# Patient Record
Sex: Female | Born: 1970 | Race: White | Marital: Married | Smoking: Former smoker
Health system: Southern US, Community
[De-identification: ages and names within clinical notes are randomized; demographics above are authoritative.]

## PROBLEM LIST (undated history)

## (undated) DIAGNOSIS — R5383 Other fatigue: Secondary | ICD-10-CM

## (undated) DIAGNOSIS — E559 Vitamin D deficiency, unspecified: Secondary | ICD-10-CM

## (undated) DIAGNOSIS — R002 Palpitations: Secondary | ICD-10-CM

## (undated) DIAGNOSIS — E538 Deficiency of other specified B group vitamins: Secondary | ICD-10-CM

## (undated) DIAGNOSIS — N939 Abnormal uterine and vaginal bleeding, unspecified: Secondary | ICD-10-CM

## (undated) DIAGNOSIS — M549 Dorsalgia, unspecified: Secondary | ICD-10-CM

## (undated) DIAGNOSIS — I10 Essential (primary) hypertension: Secondary | ICD-10-CM

## (undated) DIAGNOSIS — R7303 Prediabetes: Secondary | ICD-10-CM

## (undated) DIAGNOSIS — Z789 Other specified health status: Secondary | ICD-10-CM

## (undated) HISTORY — PX: WISDOM TOOTH EXTRACTION: SHX21

## (undated) HISTORY — DX: Palpitations: R00.2

## (undated) HISTORY — DX: Abnormal uterine and vaginal bleeding, unspecified: N93.9

## (undated) HISTORY — PX: OTHER SURGICAL HISTORY: SHX169

## (undated) HISTORY — DX: Other specified health status: Z78.9

## (undated) HISTORY — DX: Essential (primary) hypertension: I10

## (undated) HISTORY — DX: Prediabetes: R73.03

## (undated) HISTORY — DX: Deficiency of other specified B group vitamins: E53.8

## (undated) HISTORY — DX: Vitamin D deficiency, unspecified: E55.9

## (undated) HISTORY — DX: Dorsalgia, unspecified: M54.9

## (undated) HISTORY — DX: Other fatigue: R53.83

---

## 1993-09-28 HISTORY — PX: LEEP: SHX91

## 2012-06-17 ENCOUNTER — Ambulatory Visit: Payer: Self-pay | Admitting: Family Medicine

## 2012-06-17 ENCOUNTER — Encounter: Payer: Self-pay | Admitting: Family Medicine

## 2012-06-17 ENCOUNTER — Ambulatory Visit (INDEPENDENT_AMBULATORY_CARE_PROVIDER_SITE_OTHER): Payer: No Typology Code available for payment source | Admitting: Family Medicine

## 2012-06-17 VITALS — BP 107/76 | HR 77 | Ht 63.0 in | Wt 140.0 lb

## 2012-06-17 DIAGNOSIS — M79671 Pain in right foot: Secondary | ICD-10-CM

## 2012-06-17 DIAGNOSIS — M79609 Pain in unspecified limb: Secondary | ICD-10-CM

## 2012-06-17 MED ORDER — DICLOFENAC SODIUM 75 MG PO TBEC: 75.0000 mg | DELAYED_RELEASE_TABLET | Freq: Two times a day (BID) | ORAL | Status: AC

## 2012-06-17 NOTE — Patient Instructions (Addendum)
You are tender at the 4th tarsal-metatarsal joint. This is typically due to start of acute gout flare or severe synovitis. Other possibilities are metatarsalgia, stress fracture though these are unlikely and your ultrasound is negative for a stress fracture. Take voltaren twice a day with food for pain and inflammation. Ice 15 minutes at a time 3-4 times a day. Elevate above the level of your heart when possible. Crutches if needed. Follow up with me in 2 weeks or as needed.

## 2012-06-19 ENCOUNTER — Encounter: Payer: Self-pay | Admitting: Family Medicine

## 2012-06-19 DIAGNOSIS — M79671 Pain in right foot: Secondary | ICD-10-CM

## 2012-06-19 HISTORY — DX: Pain in right foot: M79.671

## 2012-06-19 NOTE — Progress Notes (Signed)
  Subjective:    Patient ID: Tonya Duran, female    DOB: 04/27/1971, 41 y.o.   MRN: [id number]  PCP: Neil Crouch  HPI 41 yo F here for right foot pain.  Patient denies known injury. States over past few days has developed worsening lateral right foot pain. No swelling or bruising. On Monday did a small walk/jog but no pain with this or for next day afterwards. No prior h/o gout. Pain feels sharp with walking. No h/o RA or other autoimmune disease. Taking ibuprofen.  History reviewed. No pertinent past medical history.  No current outpatient prescriptions on file prior to visit.    History reviewed. No pertinent past surgical history.  No Known Allergies  History   Social History  . Marital Status: Married    Spouse Name: N/A    Number of Children: N/A  . Years of Education: N/A   Occupational History  . Not on file.   Social History Main Topics  . Smoking status: Never Smoker   . Smokeless tobacco: Not on file  . Alcohol Use: Not on file  . Drug Use: Not on file  . Sexually Active: Not on file   Other Topics Concern  . Not on file   Social History Narrative  . No narrative on file    Family History  Problem Relation Age of Onset  . Sudden death Neg Hx   . Hypertension Neg Hx   . Hyperlipidemia Neg Hx   . Heart attack Neg Hx   . Diabetes Neg Hx     BP 107/76  Pulse 77  Ht 5\' 3"  (1.6 m)  Wt 140 lb (63.504 kg)  BMI 24.80 kg/m2  Review of Systems See HPI above.    Objective:   Physical Exam Gen: NAD  R foot/ankle: No gross deformity, swelling, ecchymoses. FROM with 5/5 strength all motions without pain. TTP at 4th tarsal-MT junction.  Minimal 5th MT TTP.  No other TTP about foot or ankle. Negative ant drawer and talar tilt.   Negative syndesmotic compression. Thompsons test negative. NV intact distally.  MSK u/s R foot:  Increased fluid at 4th tarsal-MT junction, no obvious deposits.  No bony irregularities, cortical edema,  neovascularity of 3rd-5th MTs.    Assessment & Plan:  1. Right foot pain - Unusual presentation and location of pain.  Suggestive of acute gout or synovitis.  Metatarsalgia possible.  No focal bony tenderness, risk factors for stress fracture and ultrasound negative for this.  Start with voltaren, icing, postop shoe (felt more comfortable with this), elevation.  F/u in 2 weeks for reevaluation.

## 2012-06-19 NOTE — Assessment & Plan Note (Signed)
Unusual presentation and location of pain.  Suggestive of acute gout or synovitis.  Metatarsalgia possible.  No focal bony tenderness, risk factors for stress fracture and ultrasound negative for this.  Start with voltaren, icing, postop shoe (felt more comfortable with this), elevation.  F/u in 2 weeks for reevaluation.

## 2012-07-01 ENCOUNTER — Ambulatory Visit: Payer: No Typology Code available for payment source | Admitting: Family Medicine

## 2012-07-19 ENCOUNTER — Other Ambulatory Visit (HOSPITAL_COMMUNITY)
Admission: RE | Admit: 2012-07-19 | Discharge: 2012-07-19 | Disposition: A | Discharge status: Home / Self Care | Payer: No Typology Code available for payment source | Source: Ambulatory Visit | Attending: Obstetrics & Gynecology | Admitting: Obstetrics & Gynecology

## 2012-07-19 DIAGNOSIS — Z1151 Encounter for screening for human papillomavirus (HPV): Secondary | ICD-10-CM | POA: Insufficient documentation

## 2012-07-19 DIAGNOSIS — Z124 Encounter for screening for malignant neoplasm of cervix: Secondary | ICD-10-CM | POA: Insufficient documentation

## 2014-02-21 ENCOUNTER — Encounter: Payer: Self-pay | Admitting: Physician Assistant

## 2014-02-21 ENCOUNTER — Ambulatory Visit (INDEPENDENT_AMBULATORY_CARE_PROVIDER_SITE_OTHER): Payer: No Typology Code available for payment source | Admitting: Physician Assistant

## 2014-02-21 VITALS — BP 104/78 | HR 80 | Temp 98.2°F | Resp 16 | Ht 63.0 in | Wt 153.2 lb

## 2014-02-21 DIAGNOSIS — R1031 Right lower quadrant pain: Secondary | ICD-10-CM

## 2014-02-21 NOTE — Progress Notes (Signed)
Pre visit review using our clinic review tool, if applicable. No additional management support is needed unless otherwise documented below in the visit note/SLS  

## 2014-02-21 NOTE — Patient Instructions (Signed)
Take Aleve as directed over the next week.  Avoid overexertion or heavy lifting.  Apply a topical Icy Hot, Aspercreme or Salon Pas to your hip and thigh.  Apply heating pad to area for 15 minutes, a few times each day.  Return in 1 week as scheduled.  If symptoms persisting, we will need to obtain imaging and set you up with a Sports medicine physician.

## 2014-02-21 NOTE — Progress Notes (Signed)
Patient presents to clinic today c/o pain in her right inner thigh and hip that has been present for 1 week.  Pain is throbbing and pulling in nature.  Is only present with external rotation of the hip.  Denies shooting pains.  Denies trauma or injury.  Is physically active.  Denies numbness, tingling or weakness of affected extremity.  Denies back pain.   No past medical history on file.  No current outpatient prescriptions on file prior to visit.   No current facility-administered medications on file prior to visit.    No Known Allergies  Family History  Problem Relation Age of Onset  . Sudden death Neg Hx   . Hypertension Neg Hx   . Hyperlipidemia Neg Hx   . Heart attack Neg Hx   . Lung cancer Mother     Living  . Lung cancer Father 85    Deceased  . Alzheimer's disease Maternal Grandmother   . Diabetes Paternal Uncle   . Arthritis Mother   . Healthy Brother     half-brother  . Healthy Sister     half-sister  . Asthma Son   . Allergies Son   . Healthy Daughter     x1  . Healthy Son     #2    History   Social History  . Marital Status: Married    Spouse Name: N/A    Number of Children: N/A  . Years of Education: N/A   Social History Main Topics  . Smoking status: Former Smoker    Quit date: 09/28/1993  . Smokeless tobacco: None  . Alcohol Use: None  . Drug Use: None  . Sexual Activity: None   Other Topics Concern  . None   Social History Narrative  . None   Review of Systems - See HPI.  All other ROS are negative.  BP 104/78  Pulse 80  Temp(Src) 98.2 F (36.8 C) (Oral)  Resp 16  Ht 5\' 3"  (1.6 m)  Wt 153 lb 4 oz (69.514 kg)  BMI 27.15 kg/m2  SpO2 98%  LMP 02/07/2014  Physical Exam  Vitals reviewed. Constitutional: She is oriented to person, place, and time and well-developed, well-nourished, and in no distress.  HENT:  Head: Normocephalic and atraumatic.  Cardiovascular: Normal rate, regular rhythm, normal heart sounds and intact distal  pulses.   Pulmonary/Chest: Effort normal and breath sounds normal. No respiratory distress. She has no wheezes. She has no rales. She exhibits no tenderness.  Musculoskeletal:       Right hip: She exhibits normal range of motion, normal strength and no bony tenderness.       Left hip: Normal.       Right knee: Normal.       Left knee: Normal.       Right ankle: Normal.       Left ankle: Normal.       Right upper leg: She exhibits tenderness. She exhibits no bony tenderness, no swelling, no edema, no deformity and no laceration.       Left upper leg: Normal.       Right lower leg: Normal.       Left lower leg: Normal.       Right foot: Normal.       Left foot: Normal.  Pain in groin and thigh with abduction and external rotation of the right hip. ROM is otherwise normal and without pain. Negative straight leg raise.   Neurological: She is alert and  oriented to person, place, and time.  Skin: Skin is warm and dry. No rash noted.  Psychiatric: Affect normal.   Assessment/Plan: Right groin pain Alternate tylenol and ibuprofen.  Avoid heavy lifting or activities involving extensive hip abduction.  Avoid lying on affected side.  Heat to area.  Topical Icy Hot or Aspercreme.  Follow-up in 1 week.

## 2014-02-23 DIAGNOSIS — R1031 Right lower quadrant pain: Secondary | ICD-10-CM | POA: Insufficient documentation

## 2014-02-23 HISTORY — DX: Right lower quadrant pain: R10.31

## 2014-02-23 NOTE — Assessment & Plan Note (Signed)
Alternate tylenol and ibuprofen.  Avoid heavy lifting or activities involving extensive hip abduction.  Avoid lying on affected side.  Heat to area.  Topical Icy Hot or Aspercreme.  Follow-up in 1 week.

## 2014-02-27 ENCOUNTER — Ambulatory Visit (HOSPITAL_BASED_OUTPATIENT_CLINIC_OR_DEPARTMENT_OTHER)
Admission: RE | Admit: 2014-02-27 | Discharge: 2014-02-27 | Disposition: A | Discharge status: Home / Self Care | Payer: No Typology Code available for payment source | Source: Ambulatory Visit | Attending: Physician Assistant | Admitting: Physician Assistant

## 2014-02-27 ENCOUNTER — Encounter: Payer: Self-pay | Admitting: Physician Assistant

## 2014-02-27 ENCOUNTER — Ambulatory Visit (INDEPENDENT_AMBULATORY_CARE_PROVIDER_SITE_OTHER): Payer: No Typology Code available for payment source | Admitting: Physician Assistant

## 2014-02-27 VITALS — BP 106/70 | HR 80 | Temp 98.2°F

## 2014-02-27 DIAGNOSIS — M948X9 Other specified disorders of cartilage, unspecified sites: Secondary | ICD-10-CM | POA: Insufficient documentation

## 2014-02-27 DIAGNOSIS — M25559 Pain in unspecified hip: Secondary | ICD-10-CM | POA: Insufficient documentation

## 2014-02-27 DIAGNOSIS — Z01419 Encounter for gynecological examination (general) (routine) without abnormal findings: Secondary | ICD-10-CM

## 2014-02-27 DIAGNOSIS — R1031 Right lower quadrant pain: Secondary | ICD-10-CM

## 2014-02-27 DIAGNOSIS — Z0000 Encounter for general adult medical examination without abnormal findings: Secondary | ICD-10-CM

## 2014-02-27 DIAGNOSIS — H6990 Unspecified Eustachian tube disorder, unspecified ear: Secondary | ICD-10-CM

## 2014-02-27 DIAGNOSIS — M25551 Pain in right hip: Secondary | ICD-10-CM

## 2014-02-27 DIAGNOSIS — Z1239 Encounter for other screening for malignant neoplasm of breast: Secondary | ICD-10-CM

## 2014-02-27 DIAGNOSIS — H6980 Other specified disorders of Eustachian tube, unspecified ear: Secondary | ICD-10-CM

## 2014-02-27 MED ORDER — FLUTICASONE PROPIONATE 50 MCG/ACT NA SUSP: 2.0000 | Freq: Every day | NASAL | Status: DC

## 2014-02-27 NOTE — Progress Notes (Signed)
Pre visit review using our clinic review tool, if applicable. No additional management support is needed unless otherwise documented below in the visit note. 

## 2014-02-27 NOTE — Progress Notes (Signed)
Patient presents to clinic today to formally establish care and for CPE. Patient seen last week for acute concern.  Acute Concerns: Patient still complains of pain in right hip and groin, despite conservative measures.  Denies radiation of pain, numbness, tingling or weakness.  Still without back pain. Denies new symptoms.  L ear fullness --  No tinnitus.  Low-salt intake. Neg family hx of meniere disease.  No concerns in R ear.  Denies sinus pressure.   Health Maintenance: Dental -- Up-to-date Vision -- Up-to-date Immunizations -- Thinks Tetanus is UTD.  Will obtain medical records. Mammogram -- Overdue.  [facility]. PAP -- Due in 2016; history or precancerous cervical lesions. S/p LEEP.  History reviewed. No pertinent past medical history.  Past Surgical History  Procedure Laterality Date  . Leep  1995  . Wisdom tooth extraction      No current outpatient prescriptions on file prior to visit.   No current facility-administered medications on file prior to visit.    No Known Allergies  Family History  Problem Relation Age of Onset  . Sudden death Neg Hx   . Hypertension Neg Hx   . Hyperlipidemia Neg Hx   . Heart attack Neg Hx   . Lung cancer Mother     Living  . Arthritis Mother   . Lung cancer Father 1    Deceased  . Alzheimer's disease Maternal Grandmother   . Diabetes Paternal Uncle   . Healthy Brother     half-brother  . Healthy Sister     half-sister  . Asthma Son   . Allergies Son   . Healthy Daughter     x1  . Healthy Son     #2    History   Social History  . Marital Status: Married    Spouse Name: N/A    Number of Children: N/A  . Years of Education: N/A   Occupational History  . Not on file.   Social History Main Topics  . Smoking status: Former Smoker -- 1.50 packs/day for 10 years    Quit date: 09/28/1993  . Smokeless tobacco: Never Used  . Alcohol Use: No  . Drug Use: No  . Sexual Activity:  Not on file   Other Topics Concern  . Not on file   Social History Narrative  . No narrative on file   Review of Systems  Constitutional: Negative for fever and weight loss.  HENT: Negative for ear discharge, ear pain, hearing loss and tinnitus.   Eyes: Negative for blurred vision, double vision, photophobia and pain.  Respiratory: Negative for cough and shortness of breath.   Cardiovascular: Negative for chest pain and palpitations.  Gastrointestinal: Negative for heartburn, nausea, vomiting, abdominal pain, diarrhea, constipation, blood in stool and melena.  Genitourinary: Negative for dysuria, urgency, frequency, hematuria and flank pain.  Neurological: Negative for dizziness, loss of consciousness and headaches.  Endo/Heme/Allergies: Negative for environmental allergies.  Psychiatric/Behavioral: Negative for depression, suicidal ideas, hallucinations and substance abuse. The patient is not nervous/anxious and does not have insomnia.    BP 106/70  Pulse 80  Temp(Src) 98.2 F (36.8 C) (Oral)  SpO2 96%  LMP 02/07/2014  Physical Exam  Vitals reviewed. Constitutional: She is oriented to person, place, and time and well-developed, well-nourished, and in no distress.  HENT:  Head: Normocephalic and atraumatic.  Right Ear: External ear normal.  Left Ear: External ear normal.  Nose: Nose normal.  Mouth/Throat: Oropharynx is clear  and moist.  R TM within normal limits.  L TM with mild retraction and clear fluid behind ear drum.  Eyes: Conjunctivae are normal. Pupils are equal, round, and reactive to light.  Neck: Neck supple.  Cardiovascular: Normal rate, regular rhythm, normal heart sounds and intact distal pulses.   Pulmonary/Chest: Effort normal and breath sounds normal. No respiratory distress. She has no wheezes. She has no rales. She exhibits no tenderness.  Abdominal: Soft. Bowel sounds are normal. She exhibits no distension and no mass. There is no tenderness. There is no  rebound and no guarding.  Lymphadenopathy:    She has no cervical adenopathy.  Neurological: She is alert and oriented to person, place, and time.  Skin: Skin is warm and dry. No rash noted.  Psychiatric: Affect normal.   Assessment/Plan: Right groin pain Will obtain x-ray of hip.  Continue conservative measures.  Still suspect muscular etiology.  Referral placed to Sports Medicine.  Breast cancer screening Screening mammogram ordered.  Visit for preventive health examination History reviewed and updated.  Patient UTD on immunization.  Order placed for screening mammogram. Patient given referral to OB/GYN for routine gynecological care.  Patient to return for fasting labs.  ETD (eustachian tube dysfunction) Giving history and PE findings, suspect mild ETD as cause of ear fullness and "popping".  Rx Flonase.  Encouraged daily claritin. Call or RTC if symptoms are not improving.

## 2014-02-27 NOTE — Patient Instructions (Signed)
Please take a Claritin and use Flonase daily.  See if this helps with your ear fullness.    Please return for fasting labs.  I will call you once I have your results.   You will be contacted by specialists and for your screening mammogram.  Preventive Care for Adults, Female A healthy lifestyle and preventive care can promote health and wellness. Preventive health guidelines for women include the following key practices.  A routine yearly physical is a good way to check with your health care provider about your health and preventive screening. It is a chance to share any concerns and updates on your health and to receive a thorough exam.  Visit your dentist for a routine exam and preventive care every 6 months. Brush your teeth twice a day and floss once a day. Good oral hygiene prevents tooth decay and gum disease.  The frequency of eye exams is based on your age, health, family medical history, use of contact lenses, and other factors. Follow your health care provider's recommendations for frequency of eye exams.  Eat a healthy diet. Foods like vegetables, fruits, whole grains, low-fat dairy products, and lean protein foods contain the nutrients you need without too many calories. Decrease your intake of foods high in solid fats, added sugars, and salt. Eat the right amount of calories for you.Get information about a proper diet from your health care provider, if necessary.  Regular physical exercise is one of the most important things you can do for your health. Most adults should get at least 150 minutes of moderate-intensity exercise (any activity that increases your heart rate and causes you to sweat) each week. In addition, most adults need muscle-strengthening exercises on 2 or more days a week.  Maintain a healthy weight. The body mass index (BMI) is a screening tool to identify possible weight problems. It provides an estimate of body fat based on height and weight. Your health care  provider can find your BMI, and can help you achieve or maintain a healthy weight.For adults 20 years and older:  A BMI below 18.5 is considered underweight.  A BMI of 18.5 to 24.9 is normal.  A BMI of 25 to 29.9 is considered overweight.  A BMI of 30 and above is considered obese.  Maintain normal blood lipids and cholesterol levels by exercising and minimizing your intake of saturated fat. Eat a balanced diet with plenty of fruit and vegetables. Blood tests for lipids and cholesterol should begin at age 54 and be repeated every 5 years. If your lipid or cholesterol levels are high, you are over 50, or you are at high risk for heart disease, you may need your cholesterol levels checked more frequently.Ongoing high lipid and cholesterol levels should be treated with medicines if diet and exercise are not working.  If you smoke, find out from your health care provider how to quit. If you do not use tobacco, do not start.  Lung cancer screening is recommended for adults aged 5 80 years who are at high risk for developing lung cancer because of a history of smoking. A yearly low-dose CT scan of the lungs is recommended for people who have at least a 30-pack-year history of smoking and are a current smoker or have quit within the past 15 years. A pack year of smoking is smoking an average of 1 pack of cigarettes a day for 1 year (for example: 1 pack a day for 30 years or 2 packs a day for  15 years). Yearly screening should continue until the smoker has stopped smoking for at least 15 years. Yearly screening should be stopped for people who develop a health problem that would prevent them from having lung cancer treatment.  If you are pregnant, do not drink alcohol. If you are breastfeeding, be very cautious about drinking alcohol. If you are not pregnant and choose to drink alcohol, do not have more than 1 drink per day. One drink is considered to be 12 ounces (355 mL) of beer, 5 ounces (148 mL) of  wine, or 1.5 ounces (44 mL) of liquor.  Avoid use of street drugs. Do not share needles with anyone. Ask for help if you need support or instructions about stopping the use of drugs.  High blood pressure causes heart disease and increases the risk of stroke. Your blood pressure should be checked at least every 1 to 2 years. Ongoing high blood pressure should be treated with medicines if weight loss and exercise do not work.  If you are 50 43 years old, ask your health care provider if you should take aspirin to prevent strokes.  Diabetes screening involves taking a blood sample to check your fasting blood sugar level. This should be done once every 3 years, after age 27, if you are within normal weight and without risk factors for diabetes. Testing should be considered at a younger age or be carried out more frequently if you are overweight and have at least 1 risk factor for diabetes.  Breast cancer screening is essential preventive care for women. You should practice "breast self-awareness." This means understanding the normal appearance and feel of your breasts and may include breast self-examination. Any changes detected, no matter how small, should be reported to a health care provider. Women in their 70s and 30s should have a clinical breast exam (CBE) by a health care provider as part of a regular health exam every 1 to 3 years. After age 74, women should have a CBE every year. Starting at age 39, women should consider having a mammogram (breast X-ray test) every year. Women who have a family history of breast cancer should talk to their health care provider about genetic screening. Women at a high risk of breast cancer should talk to their health care providers about having an MRI and a mammogram every year.  Breast cancer gene (BRCA)-related cancer risk assessment is recommended for women who have family members with BRCA-related cancers. BRCA-related cancers include breast, ovarian, tubal, and  peritoneal cancers. Having family members with these cancers may be associated with an increased risk for harmful changes (mutations) in the breast cancer genes BRCA1 and BRCA2. Results of the assessment will determine the need for genetic counseling and BRCA1 and BRCA2 testing.  The Pap test is a screening test for cervical cancer. A Pap test can show cell changes on the cervix that might become cervical cancer if left untreated. A Pap test is a procedure in which cells are obtained and examined from the lower end of the uterus (cervix).  Women should have a Pap test starting at age 37.  Between ages 58 and 52, Pap tests should be repeated every 2 years.  Beginning at age 52, you should have a Pap test every 3 years as long as the past 3 Pap tests have been normal.  Some women have medical problems that increase the chance of getting cervical cancer. Talk to your health care provider about these problems. It is especially important to  talk to your health care provider if a new problem develops soon after your last Pap test. In these cases, your health care provider may recommend more frequent screening and Pap tests.  The above recommendations are the same for women who have or have not gotten the vaccine for human papillomavirus (HPV).  If you had a hysterectomy for a problem that was not cancer or a condition that could lead to cancer, then you no longer need Pap tests. Even if you no longer need a Pap test, a regular exam is a good idea to make sure no other problems are starting.  If you are between ages 72 and 68 years, and you have had normal Pap tests going back 10 years, you no longer need Pap tests. Even if you no longer need a Pap test, a regular exam is a good idea to make sure no other problems are starting.  If you have had past treatment for cervical cancer or a condition that could lead to cancer, you need Pap tests and screening for cancer for at least 20 years after your  treatment.  If Pap tests have been discontinued, risk factors (such as a new sexual partner) need to be reassessed to determine if screening should be resumed.  The HPV test is an additional test that may be used for cervical cancer screening. The HPV test looks for the virus that can cause the cell changes on the cervix. The cells collected during the Pap test can be tested for HPV. The HPV test could be used to screen women aged 56 years and older, and should be used in women of any age who have unclear Pap test results. After the age of 53, women should have HPV testing at the same frequency as a Pap test.  Colorectal cancer can be detected and often prevented. Most routine colorectal cancer screening begins at the age of 62 years and continues through age 47 years. However, your health care provider may recommend screening at an earlier age if you have risk factors for colon cancer. On a yearly basis, your health care provider may provide home test kits to check for hidden blood in the stool. Use of a small camera at the end of a tube, to directly examine the colon (sigmoidoscopy or colonoscopy), can detect the earliest forms of colorectal cancer. Talk to your health care provider about this at age 49, when routine screening begins. Direct exam of the colon should be repeated every 5 10 years through age 70 years, unless early forms of pre-cancerous polyps or small growths are found.  People who are at an increased risk for hepatitis B should be screened for this virus. You are considered at high risk for hepatitis B if:  You were born in a country where hepatitis B occurs often. Talk with your health care provider about which countries are considered high risk.  Your parents were born in a high-risk country and you have not received a shot to protect against hepatitis B (hepatitis B vaccine).  You have HIV or AIDS.  You use needles to inject street drugs.  You live with, or have sex with,  someone who has Hepatitis B.  You get hemodialysis treatment.  You take certain medicines for conditions like cancer, organ transplantation, and autoimmune conditions.  Hepatitis C blood testing is recommended for all people born from [birth year] through 1965 and any individual with known risks for hepatitis C.  Practice safe sex. Use condoms and avoid  high-risk sexual practices to reduce the spread of sexually transmitted infections (STIs). STIs include gonorrhea, chlamydia, syphilis, trichomonas, herpes, HPV, and human immunodeficiency virus (HIV). Herpes, HIV, and HPV are viral illnesses that have no cure. They can result in disability, cancer, and death. Sexually active women aged 58 years and younger should be checked for chlamydia. Older women with new or multiple partners should also be tested for chlamydia. Testing for other STIs is recommended if you are sexually active and at increased risk.  Osteoporosis is a disease in which the bones lose minerals and strength with aging. This can result in serious bone fractures or breaks. The risk of osteoporosis can be identified using a bone density scan. Women ages 70 years and over and women at risk for fractures or osteoporosis should discuss screening with their health care providers. Ask your health care provider whether you should take a calcium supplement or vitamin D to reduce the rate of osteoporosis.  Menopause can be associated with physical symptoms and risks. Hormone replacement therapy is available to decrease symptoms and risks. You should talk to your health care provider about whether hormone replacement therapy is right for you.  Use sunscreen. Apply sunscreen liberally and repeatedly throughout the day. You should seek shade when your shadow is shorter than you. Protect yourself by wearing long sleeves, pants, a wide-brimmed hat, and sunglasses year round, whenever you are outdoors.  Once a month, do a whole body skin exam, using a mirror  to look at the skin on your back. Tell your health care provider of new moles, moles that have irregular borders, moles that are larger than a pencil eraser, or moles that have changed in shape or color.  Stay current with required vaccines (immunizations).  Influenza vaccine. All adults should be immunized every year.  Tetanus, diphtheria, and acellular pertussis (Td, Tdap) vaccine. Pregnant women should receive 1 dose of Tdap vaccine during each pregnancy. The dose should be obtained regardless of the length of time since the last dose. Immunization is preferred during the 27th 36th week of gestation. An adult who has not previously received Tdap or who does not know her vaccine status should receive 1 dose of Tdap. This initial dose should be followed by tetanus and diphtheria toxoids (Td) booster doses every 10 years. Adults with an unknown or incomplete history of completing a 3-dose immunization series with Td-containing vaccines should begin or complete a primary immunization series including a Tdap dose. Adults should receive a Td booster every 10 years.  Varicella vaccine. An adult without evidence of immunity to varicella should receive 2 doses or a second dose if she has previously received 1 dose. Pregnant females who do not have evidence of immunity should receive the first dose after pregnancy. This first dose should be obtained before leaving the health care facility. The second dose should be obtained 4 8 weeks after the first dose.  Human papillomavirus (HPV) vaccine. Females aged 21 26 years who have not received the vaccine previously should obtain the 3-dose series. The vaccine is not recommended for use in pregnant females. However, pregnancy testing is not needed before receiving a dose. If a female is found to be pregnant after receiving a dose, no treatment is needed. In that case, the remaining doses should be delayed until after the pregnancy. Immunization is recommended for any  person with an immunocompromised condition through the age of 44 years if she did not get any or all doses earlier. During the 3-dose  series, the second dose should be obtained 4 8 weeks after the first dose. The third dose should be obtained 24 weeks after the first dose and 16 weeks after the second dose.  Zoster vaccine. One dose is recommended for adults aged 60 years or older unless certain conditions are present.  Measles, mumps, and rubella (MMR) vaccine. Adults born before [birth year] generally are considered immune to measles and mumps. Adults born in [birth year] or later should have 1 or more doses of MMR vaccine unless there is a contraindication to the vaccine or there is laboratory evidence of immunity to each of the three diseases. A routine second dose of MMR vaccine should be obtained at least 28 days after the first dose for students attending postsecondary schools, health care workers, or international travelers. People who received inactivated measles vaccine or an unknown type of measles vaccine during 1963 1967 should receive 2 doses of MMR vaccine. People who received inactivated mumps vaccine or an unknown type of mumps vaccine before 1979 and are at high risk for mumps infection should consider immunization with 2 doses of MMR vaccine. For females of childbearing age, rubella immunity should be determined. If there is no evidence of immunity, females who are not pregnant should be vaccinated. If there is no evidence of immunity, females who are pregnant should delay immunization until after pregnancy. Unvaccinated health care workers born before [birth year] who lack laboratory evidence of measles, mumps, or rubella immunity or laboratory confirmation of disease should consider measles and mumps immunization with 2 doses of MMR vaccine or rubella immunization with 1 dose of MMR vaccine.  Pneumococcal 13-valent conjugate (PCV13) vaccine. When indicated, a person who is uncertain of her immunization history  and has no record of immunization should receive the PCV13 vaccine. An adult aged 71 years or older who has certain medical conditions and has not been previously immunized should receive 1 dose of PCV13 vaccine. This PCV13 should be followed with a dose of pneumococcal polysaccharide (PPSV23) vaccine. The PPSV23 vaccine dose should be obtained at least 8 weeks after the dose of PCV13 vaccine. An adult aged 49 years or older who has certain medical conditions and previously received 1 or more doses of PPSV23 vaccine should receive 1 dose of PCV13. The PCV13 vaccine dose should be obtained 1 or more years after the last PPSV23 vaccine dose.  Pneumococcal polysaccharide (PPSV23) vaccine. When PCV13 is also indicated, PCV13 should be obtained first. All adults aged 31 years and older should be immunized. An adult younger than age 8 years who has certain medical conditions should be immunized. Any person who resides in a nursing home or long-term care facility should be immunized. An adult smoker should be immunized. People with an immunocompromised condition and certain other conditions should receive both PCV13 and PPSV23 vaccines. People with human immunodeficiency virus (HIV) infection should be immunized as soon as possible after diagnosis. Immunization during chemotherapy or radiation therapy should be avoided. Routine use of PPSV23 vaccine is not recommended for American Indians, [place] Natives, or people younger than 65 years unless there are medical conditions that require PPSV23 vaccine. When indicated, people who have unknown immunization and have no record of immunization should receive PPSV23 vaccine. One-time revaccination 5 years after the first dose of PPSV23 is recommended for people aged 81 64 years who have chronic kidney failure, nephrotic syndrome, asplenia, or immunocompromised conditions. People who received 1 2 doses of PPSV23 before age 6 years should receive another dose of PPSV23 vaccine  at age 49 years or later if at least 5 years have passed since the previous dose. Doses of PPSV23 are not needed for people immunized with PPSV23 at or after age 32 years.  Meningococcal vaccine. Adults with asplenia or persistent complement component deficiencies should receive 2 doses of quadrivalent meningococcal conjugate (MenACWY-D) vaccine. The doses should be obtained at least 2 months apart. Microbiologists working with certain meningococcal bacteria, [place] recruits, people at risk during an outbreak, and people who travel to or live in countries with a high rate of meningitis should be immunized. A first-year college student up through age 42 years who is living in a residence hall should receive a dose if she did not receive a dose on or after her 16th birthday. Adults who have certain high-risk conditions should receive one or more doses of vaccine.  Hepatitis A vaccine. Adults who wish to be protected from this disease, have certain high-risk conditions, work with hepatitis A-infected animals, work in hepatitis A research labs, or travel to or work in countries with a high rate of hepatitis A should be immunized. Adults who were previously unvaccinated and who anticipate close contact with an international adoptee during the first 60 days after arrival in the [place] from a country with a high rate of hepatitis A should be immunized.  Hepatitis B vaccine. Adults who wish to be protected from this disease, have certain high-risk conditions, may be exposed to blood or other infectious body fluids, are household contacts or sex partners of hepatitis B positive people, are clients or workers in certain care facilities, or travel to or work in countries with a high rate of hepatitis B should be immunized.  Haemophilus influenzae type b (Hib) vaccine. A previously unvaccinated person with asplenia or sickle cell disease or having a scheduled splenectomy should receive 1 dose of Hib vaccine.  Regardless of previous immunization, a recipient of a hematopoietic stem cell transplant should receive a 3-dose series 6 12 months after her successful transplant. Hib vaccine is not recommended for adults with HIV infection. Preventive Services / Frequency Ages 8 to 39years  Blood pressure check.** / Every 1 to 2 years.  Lipid and cholesterol check.** / Every 5 years beginning at age 62.  Clinical breast exam.** / Every 3 years for women in their 94s and 63s.  BRCA-related cancer risk assessment.** / For women who have family members with a BRCA-related cancer (breast, ovarian, tubal, or peritoneal cancers).  Pap test.** / Every 2 years from ages 58 through 66. Every 3 years starting at age 44 through age 47 or 56 with a history of 3 consecutive normal Pap tests.  HPV screening.** / Every 3 years from ages 74 through ages 53 to 65 with a history of 3 consecutive normal Pap tests.  Hepatitis C blood test.** / For any individual with known risks for hepatitis C.  Skin self-exam. / Monthly.  Influenza vaccine. / Every year.  Tetanus, diphtheria, and acellular pertussis (Tdap, Td) vaccine.** / Consult your health care provider. Pregnant women should receive 1 dose of Tdap vaccine during each pregnancy. 1 dose of Td every 10 years.  Varicella vaccine.** / Consult your health care provider. Pregnant females who do not have evidence of immunity should receive the first dose after pregnancy.  HPV vaccine. / 3 doses over 6 months, if 55 and younger. The vaccine is not recommended for use in pregnant females. However, pregnancy testing is not needed before receiving a dose.  Measles, mumps,  rubella (MMR) vaccine.** / You need at least 1 dose of MMR if you were born in 1957 or later. You may also need a 2nd dose. For females of childbearing age, rubella immunity should be determined. If there is no evidence of immunity, females who are not pregnant should be vaccinated. If there is no evidence of  immunity, females who are pregnant should delay immunization until after pregnancy.  Pneumococcal 13-valent conjugate (PCV13) vaccine.** / Consult your health care provider.  Pneumococcal polysaccharide (PPSV23) vaccine.** / 1 to 2 doses if you smoke cigarettes or if you have certain conditions.  Meningococcal vaccine.** / 1 dose if you are age 6 to 64 years and a Market researcher living in a residence hall, or have one of several medical conditions, you need to get vaccinated against meningococcal disease. You may also need additional booster doses.  Hepatitis A vaccine.** / Consult your health care provider.  Hepatitis B vaccine.** / Consult your health care provider.  Haemophilus influenzae type b (Hib) vaccine.** / Consult your health care provider. Ages 67 to 64years  Blood pressure check.** / Every 1 to 2 years.  Lipid and cholesterol check.** / Every 5 years beginning at age 54 years.  Lung cancer screening. / Every year if you are aged 46 80 years and have a 30-pack-year history of smoking and currently smoke or have quit within the past 15 years. Yearly screening is stopped once you have quit smoking for at least 15 years or develop a health problem that would prevent you from having lung cancer treatment.  Clinical breast exam.** / Every year after age 56 years.  BRCA-related cancer risk assessment.** / For women who have family members with a BRCA-related cancer (breast, ovarian, tubal, or peritoneal cancers).  Mammogram.** / Every year beginning at age 31 years and continuing for as long as you are in good health. Consult with your health care provider.  Pap test.** / Every 3 years starting at age 13 years through age 47 or 84 years with a history of 3 consecutive normal Pap tests.  HPV screening.** / Every 3 years from ages 39 years through ages 4 to 35 years with a history of 3 consecutive normal Pap tests.  Fecal occult blood test (FOBT) of stool. / Every  year beginning at age 66 years and continuing until age 39 years. You may not need to do this test if you get a colonoscopy every 10 years.  Flexible sigmoidoscopy or colonoscopy.** / Every 5 years for a flexible sigmoidoscopy or every 10 years for a colonoscopy beginning at age 14 years and continuing until age 22 years.  Hepatitis C blood test.** / For all people born from [birth year] through 1965 and any individual with known risks for hepatitis C.  Skin self-exam. / Monthly.  Influenza vaccine. / Every year.  Tetanus, diphtheria, and acellular pertussis (Tdap/Td) vaccine.** / Consult your health care provider. Pregnant women should receive 1 dose of Tdap vaccine during each pregnancy. 1 dose of Td every 10 years.  Varicella vaccine.** / Consult your health care provider. Pregnant females who do not have evidence of immunity should receive the first dose after pregnancy.  Zoster vaccine.** / 1 dose for adults aged 19 years or older.  Measles, mumps, rubella (MMR) vaccine.** / You need at least 1 dose of MMR if you were born in 1957 or later. You may also need a 2nd dose. For females of childbearing age, rubella immunity should be determined. If there is  no evidence of immunity, females who are not pregnant should be vaccinated. If there is no evidence of immunity, females who are pregnant should delay immunization until after pregnancy.  Pneumococcal 13-valent conjugate (PCV13) vaccine.** / Consult your health care provider.  Pneumococcal polysaccharide (PPSV23) vaccine.** / 1 to 2 doses if you smoke cigarettes or if you have certain conditions.  Meningococcal vaccine.** / Consult your health care provider.  Hepatitis A vaccine.** / Consult your health care provider.  Hepatitis B vaccine.** / Consult your health care provider.  Haemophilus influenzae type b (Hib) vaccine.** / Consult your health care provider. Ages 37 years and over  Blood pressure check.** / Every 1 to 2 years.  Lipid  and cholesterol check.** / Every 5 years beginning at age 32 years.  Lung cancer screening. / Every year if you are aged 33 80 years and have a 30-pack-year history of smoking and currently smoke or have quit within the past 15 years. Yearly screening is stopped once you have quit smoking for at least 15 years or develop a health problem that would prevent you from having lung cancer treatment.  Clinical breast exam.** / Every year after age 30 years.  BRCA-related cancer risk assessment.** / For women who have family members with a BRCA-related cancer (breast, ovarian, tubal, or peritoneal cancers).  Mammogram.** / Every year beginning at age 12 years and continuing for as long as you are in good health. Consult with your health care provider.  Pap test.** / Every 3 years starting at age 77 years through age 27 or 8 years with 3 consecutive normal Pap tests. Testing can be stopped between 65 and 70 years with 3 consecutive normal Pap tests and no abnormal Pap or HPV tests in the past 10 years.  HPV screening.** / Every 3 years from ages 86 years through ages 51 or 27 years with a history of 3 consecutive normal Pap tests. Testing can be stopped between 65 and 70 years with 3 consecutive normal Pap tests and no abnormal Pap or HPV tests in the past 10 years.  Fecal occult blood test (FOBT) of stool. / Every year beginning at age 78 years and continuing until age 75 years. You may not need to do this test if you get a colonoscopy every 10 years.  Flexible sigmoidoscopy or colonoscopy.** / Every 5 years for a flexible sigmoidoscopy or every 10 years for a colonoscopy beginning at age 74 years and continuing until age 26 years.  Hepatitis C blood test.** / For all people born from [birth year] through 1965 and any individual with known risks for hepatitis C.  Osteoporosis screening.** / A one-time screening for women ages 41 years and over and women at risk for fractures or osteoporosis.  Skin  self-exam. / Monthly.  Influenza vaccine. / Every year.  Tetanus, diphtheria, and acellular pertussis (Tdap/Td) vaccine.** / 1 dose of Td every 10 years.  Varicella vaccine.** / Consult your health care provider.  Zoster vaccine.** / 1 dose for adults aged 25 years or older.  Pneumococcal 13-valent conjugate (PCV13) vaccine.** / Consult your health care provider.  Pneumococcal polysaccharide (PPSV23) vaccine.** / 1 dose for all adults aged 26 years and older.  Meningococcal vaccine.** / Consult your health care provider.  Hepatitis A vaccine.** / Consult your health care provider.  Hepatitis B vaccine.** / Consult your health care provider.  Haemophilus influenzae type b (Hib) vaccine.** / Consult your health care provider. ** Family history and personal history of risk and  conditions may change your health care provider's recommendations. Document Released: 11/10/2001 Document Revised: 07/05/2013 Document Reviewed: 02/09/2011 [facility] Patient Information 2014 [place], [place].

## 2014-02-28 ENCOUNTER — Encounter: Payer: Self-pay | Admitting: Family Medicine

## 2014-02-28 ENCOUNTER — Ambulatory Visit (INDEPENDENT_AMBULATORY_CARE_PROVIDER_SITE_OTHER): Payer: No Typology Code available for payment source | Admitting: Family Medicine

## 2014-02-28 VITALS — BP 106/71 | HR 80 | Ht 63.0 in | Wt 151.0 lb

## 2014-02-28 DIAGNOSIS — M25551 Pain in right hip: Secondary | ICD-10-CM

## 2014-02-28 DIAGNOSIS — M25559 Pain in unspecified hip: Secondary | ICD-10-CM

## 2014-02-28 NOTE — Patient Instructions (Signed)
Your history and exam are consistent with transient synovitis of the hip, less likely snapping hip or labral tear. Avoid painful activities as much as possible. Take aleve 2 tabs twice a day with food OR ibuprofen 600mg  three times a day with food for pain and inflammation for next 4 weeks. Icing 15 minutes at a time as needed 3-4 times a day. Consider using crutches. Consider MRI arthrogram, hip injection if not improving. Follow up with me in 4 weeks.

## 2014-03-01 ENCOUNTER — Telehealth: Payer: Self-pay | Admitting: Physician Assistant

## 2014-03-01 ENCOUNTER — Ambulatory Visit: Payer: No Typology Code available for payment source | Admitting: Physician Assistant

## 2014-03-01 NOTE — Telephone Encounter (Signed)
Received medical records from [facility]-Dr. Benay Pillow

## 2014-03-02 DIAGNOSIS — Z01419 Encounter for gynecological examination (general) (routine) without abnormal findings: Secondary | ICD-10-CM | POA: Insufficient documentation

## 2014-03-02 DIAGNOSIS — Z1239 Encounter for other screening for malignant neoplasm of breast: Secondary | ICD-10-CM | POA: Insufficient documentation

## 2014-03-02 DIAGNOSIS — Z0000 Encounter for general adult medical examination without abnormal findings: Secondary | ICD-10-CM

## 2014-03-02 DIAGNOSIS — H6980 Other specified disorders of Eustachian tube, unspecified ear: Secondary | ICD-10-CM

## 2014-03-02 DIAGNOSIS — H6990 Unspecified Eustachian tube disorder, unspecified ear: Secondary | ICD-10-CM

## 2014-03-02 HISTORY — DX: Other specified disorders of Eustachian tube, unspecified ear: H69.80

## 2014-03-02 HISTORY — DX: Encounter for general adult medical examination without abnormal findings: Z00.00

## 2014-03-02 HISTORY — DX: Unspecified eustachian tube disorder, unspecified ear: H69.90

## 2014-03-02 HISTORY — DX: Encounter for other screening for malignant neoplasm of breast: Z12.39

## 2014-03-02 NOTE — Assessment & Plan Note (Signed)
History reviewed and updated.  Patient UTD on immunization.  Order placed for screening mammogram. Patient given referral to OB/GYN for routine gynecological care.  Patient to return for fasting labs.

## 2014-03-02 NOTE — Assessment & Plan Note (Signed)
Screening mammogram ordered

## 2014-03-02 NOTE — Assessment & Plan Note (Signed)
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  Rx Flonase.  Encouraged daily claritin. Call or RTC if symptoms are not improving.

## 2014-03-02 NOTE — Assessment & Plan Note (Signed)
Will obtain x-ray of hip.  Continue conservative measures.  Still suspect muscular etiology.  Referral placed to Sports Medicine.

## 2014-03-05 ENCOUNTER — Encounter: Payer: Self-pay | Admitting: Family Medicine

## 2014-03-05 DIAGNOSIS — M25551 Pain in right hip: Secondary | ICD-10-CM | POA: Insufficient documentation

## 2014-03-05 HISTORY — DX: Pain in right hip: M25.551

## 2014-03-05 NOTE — Progress Notes (Signed)
Patient ID: Tonya Duran, female   DOB: [birth year], 43 y.o.   MRN: [id number]  PCP: Leeanne Rio, PA-C  Subjective:   HPI: Patient is a 42 y.o. female here for right hip pain.  Patient reports about 2 weeks ago started having pain anterior right groin, hip. No known injury or trauma. Nothing like this before. No history of diabetes, chronic illnesses other than allergies. Feels popping. Hurts worse getting in and out of the car. Aleve has not helped. No numbness/tingling. No bowel/bladder dysfunction. No left hip pain.  History reviewed. No pertinent past medical history.  Current Outpatient Prescriptions on File Prior to Visit  Medication Sig Dispense Refill  . fluticasone (FLONASE) 50 MCG/ACT nasal spray Place 2 sprays into both nostrils daily.  16 g  6   No current facility-administered medications on file prior to visit.    Past Surgical History  Procedure Laterality Date  . [place]  1995  . Wisdom tooth extraction      No Known Allergies  History   Social History  . Marital Status: Married    Spouse Name: N/A    Number of Children: N/A  . Years of Education: N/A   Occupational History  . Not on file.   Social History Main Topics  . Smoking status: Former Smoker -- 1.50 packs/day for 10 years    Quit date: 09/28/1993  . Smokeless tobacco: Never Used  . Alcohol Use: No  . Drug Use: No  . Sexual Activity: Not on file   Other Topics Concern  . Not on file   Social History Narrative  . No narrative on file    Family History  Problem Relation Age of Onset  . Sudden death Neg Hx   . Hypertension Neg Hx   . Hyperlipidemia Neg Hx   . Heart attack Neg Hx   . Lung cancer Mother     Living  . Arthritis Mother   . Lung cancer Father 32    Deceased  . Alzheimer's disease Maternal Grandmother   . Diabetes Paternal Uncle   . Healthy Brother     half-brother  . Healthy Sister     half-sister  . Asthma Son   . Allergies Son   . Healthy  Daughter     x1  . Healthy Son     #2    BP 106/71  Pulse 80  Ht 5\' 3"  (1.6 m)  Wt 151 lb (68.493 kg)  BMI 26.76 kg/m2  LMP 02/07/2014  Review of Systems: See HPI above.    Objective:  Physical Exam:  Gen: NAD  Right hip/back: No gross deformity, scoliosis. TTP anterior hip joint.  No trochanter, back, other tenderness. FROM of back.  FROM of hip but pain reproduced with IR and ER. Strength LEs 5/5 all muscle groups.  No snapping reproduced on standing hip rotations. Negative SLRs. Sensation intact to light touch bilaterally. Positive right hip logroll. Negative fabers and piriformis stretches.  Pain in right groin with right fabers.    Assessment & Plan:  1. Right hip pain - consistent with transient synovitis of the hip, less likely snapping hip or degenerative labral tear.  AVN very unlikely - no risk factors for this.  Radiographs negative for fracture, AVN, DJD.  Start with regular nsaids, icing.  Consider crutches while this resolves.  Consider intraarticular injection, MR arthrogram if still struggling.  F/u in 4 weeks.

## 2014-03-05 NOTE — Assessment & Plan Note (Signed)
consistent with transient synovitis of the hip, less likely snapping hip or degenerative labral tear.  AVN very unlikely - no risk factors for this.  Radiographs negative for fracture, AVN, DJD.  Start with regular nsaids, icing.  Consider crutches while this resolves.  Consider intraarticular injection, MR arthrogram if still struggling.  F/u in 4 weeks.

## 2018-09-16 ENCOUNTER — Encounter (HOSPITAL_COMMUNITY): Payer: Self-pay

## 2018-09-16 ENCOUNTER — Encounter: Payer: Self-pay | Admitting: Obstetrics & Gynecology

## 2018-09-16 ENCOUNTER — Ambulatory Visit (INDEPENDENT_AMBULATORY_CARE_PROVIDER_SITE_OTHER): Payer: No Typology Code available for payment source | Admitting: Obstetrics & Gynecology

## 2018-09-16 VITALS — BP 112/74 | HR 63 | Ht 63.0 in | Wt 143.1 lb

## 2018-09-16 DIAGNOSIS — Z124 Encounter for screening for malignant neoplasm of cervix: Secondary | ICD-10-CM

## 2018-09-16 DIAGNOSIS — N393 Stress incontinence (female) (male): Secondary | ICD-10-CM

## 2018-09-16 DIAGNOSIS — Z01419 Encounter for gynecological examination (general) (routine) without abnormal findings: Secondary | ICD-10-CM

## 2018-09-16 DIAGNOSIS — N923 Ovulation bleeding: Secondary | ICD-10-CM | POA: Diagnosis not present

## 2018-09-16 DIAGNOSIS — F3281 Premenstrual dysphoric disorder: Secondary | ICD-10-CM | POA: Diagnosis not present

## 2018-09-16 DIAGNOSIS — Z1151 Encounter for screening for human papillomavirus (HPV): Secondary | ICD-10-CM

## 2018-09-16 MED ORDER — SERTRALINE HCL 25 MG PO TABS: 25.0000 mg | ORAL_TABLET | Freq: Every day | ORAL | 3 refills | Status: DC

## 2018-09-16 NOTE — Patient Instructions (Addendum)
Endometrial Ablation Endometrial ablation is a procedure that destroys the thin inner layer of the lining of the uterus (endometrium). This procedure may be done:  To stop heavy periods.  To stop bleeding that is causing anemia.  To control irregular bleeding.  To treat bleeding caused by small tumors (fibroids) in the endometrium. This procedure is often an alternative to major surgery, such as removal of the uterus and cervix (hysterectomy). As a result of this procedure:  You may not be able to have children. However, if you are premenopausal (you have not gone through menopause): ? You may still have a small chance of getting pregnant. ? You will need to use a reliable method of birth control after the procedure to prevent pregnancy.  You may stop having a menstrual period, or you may have only a small amount of bleeding during your period. Menstruation may return several years after the procedure. Tell a health care provider about:  Any allergies you have.  All medicines you are taking, including vitamins, herbs, eye drops, creams, and over-the-counter medicines.  Any problems you or family members have had with the use of anesthetic medicines.  Any blood disorders you have.  Any surgeries you have had.  Any medical conditions you have. What are the risks? Generally, this is a safe procedure. However, problems may occur, including:  A hole (perforation) in the uterus or bowel.  Infection of the uterus, bladder, or vagina.  Bleeding.  Damage to other structures or organs.  An air bubble in the lung (air embolus).  Problems with pregnancy after the procedure.  Failure of the procedure.  Decreased ability to diagnose cancer in the endometrium. What happens before the procedure?  You will have tests of your endometrium to make sure there are no pre-cancerous cells or cancer cells present.  You may have an ultrasound of the uterus.  You may be given medicines to  thin the endometrium.  Ask your health care provider about: ? Changing or stopping your regular medicines. This is especially important if you take diabetes medicines or blood thinners. ? Taking medicines such as aspirin and ibuprofen. These medicines can thin your blood. Do not take these medicines before your procedure if your doctor tells you not to.  Plan to have someone take you home from the hospital or clinic. What happens during the procedure?   You will lie on an exam table with your feet and legs supported as in a pelvic exam.  To lower your risk of infection: ? Your health care team will wash or sanitize their hands and put on germ-free (sterile) gloves. ? Your genital area will be washed with soap.  An IV tube will be inserted into one of your veins.  You will be given a medicine to help you relax (sedative).  A surgical instrument with a light and camera (resectoscope) will be inserted into your vagina and moved into your uterus. This allows your surgeon to see inside your uterus.  Endometrial tissue will be removed using one of the following methods: ? Radiofrequency. This method uses a radiofrequency-alternating electric current to remove the endometrium. ? Cryotherapy. This method uses extreme cold to freeze the endometrium. ? Heated-free liquid. This method uses a heated saltwater (saline) solution to remove the endometrium. ? Microwave. This method uses high-energy microwaves to heat up the endometrium and remove it. ? Thermal balloon. This method involves inserting a catheter with a balloon tip into the uterus. The balloon tip is filled with   heated fluid to remove the endometrium. The procedure may vary among health care providers and hospitals. What happens after the procedure?  Your blood pressure, heart rate, breathing rate, and blood oxygen level will be monitored until the medicines you were given have worn off.  As tissue healing occurs, you may notice  vaginal bleeding for 4-6 weeks after the procedure. You may also experience: ? Cramps. ? Thin, watery vaginal discharge that is light pink or brown in color. ? A need to urinate more frequently than usual. ? Nausea.  Do not drive for 24 hours if you were given a sedative.  Do not have sex or insert anything into your vagina until your health care provider approves. Summary  Endometrial ablation is done to treat the many causes of heavy menstrual bleeding.  The procedure may be done only after medications have been tried to control the bleeding.  Plan to have someone take you home from the hospital or clinic. This information is not intended to replace advice given to you by your health care provider. Make sure you discuss any questions you have with your health care provider. Document Released: 07/24/2004 Document Revised: 03/01/2018 Document Reviewed: 10/01/2016 Elsevier Interactive Patient Education  2019 [place]. Urinary Incontinence  Urinary incontinence refers to a condition in which a person is unable to control where and when to pass urine. A person with this condition will urinate when he or she does not mean to (involuntarily). What are the causes? This condition may be caused by:  Medicines.  Infections.  Constipation.  Overactive bladder muscles.  Weak bladder muscles.  Weak pelvic floor muscles. These muscles provide support for the bladder, intestine, and, in women, the uterus.  Enlarged prostate in men. The prostate is a gland near the bladder. When it gets too big, it can pinch the urethra. With the urethra blocked, the bladder can weaken and lose the ability to empty properly.  Surgery.  Emotional factors, such as anxiety, stress, or post-traumatic stress disorder (PTSD).  Pelvic organ prolapse. This happens in women when organs shift out of place and into the vagina. This shift can prevent the bladder and urethra from working properly. What increases  the risk? The following factors may make you more likely to develop this condition:  Older age.  Obesity and physical inactivity.  Pregnancy and childbirth.  Menopause.  Diseases that affect the nerves or spinal cord (neurological diseases).  Long-term (chronic) coughing. This can increase pressure on the bladder and pelvic floor muscles. What are the signs or symptoms? Symptoms may vary depending on the type of urinary incontinence you have. They include:  A sudden urge to urinate, but passing urine involuntarily before you can get to a bathroom (urge incontinence).  Suddenly passing urine with any activity that forces urine to pass, such as coughing, laughing, exercise, or sneezing (stress incontinence).  Needing to urinate often, but urinating only a small amount, or constantly dribbling urine (overflow incontinence).  Urinating because you cannot get to the bathroom in time due to a physical disability, such as arthritis or injury, or communication and thinking problems, such as Alzheimer disease (functional incontinence). How is this diagnosed? This condition may be diagnosed based on:  Your medical history.  A physical exam.  Tests, such as: ? Urine tests. ? X-rays of your kidney and bladder. ? Ultrasound. ? CT scan. ? Cystoscopy. In this procedure, a health care provider inserts a tube with a light and camera (cystoscope) through the urethra and  into the bladder in order to check for problems. ? Urodynamic testing. These tests assess how well the bladder, urethra, and sphincter can store and release urine. There are different types of urodynamic tests, and they vary depending on what the test is measuring. To help diagnose your condition, your health care provider may recommend that you keep a log of when you urinate and how much you urinate. How is this treated? Treatment for this condition depends on the type of incontinence that you have and its cause. Treatment may  include:  Lifestyle changes, such as: ? Quitting smoking. ? Maintaining a healthy weight. ? Staying active. Try to get 150 minutes of moderate-intensity exercise every week. Ask your health care provider which activities are safe for you. ? Eating a healthy diet.  Avoid high-fat foods, like fried foods.  Avoid refined carbohydrates like white bread and white rice.  Limit how much alcohol and caffeine you drink.  Increase your fiber intake. Foods such as fresh fruits, vegetables, beans, and whole grains are healthy sources of fiber.  Pelvic floor muscle exercises.  Bladder training, such as lengthening the amount of time between bathroom breaks, or using the bathroom at regular intervals.  Using techniques to suppress bladder urges. This can include distraction techniques or controlled breathing exercises.  Medicines to relax the bladder muscles and prevent bladder spasms.  Medicines to help slow or prevent the growth of a man's prostate.  Botox injections. These can help relax the bladder muscles.  Using pulses of electricity to help change bladder reflexes (electrical nerve stimulation).  For women, using a medical device to prevent urine leaks. This is a small, tampon-like, disposable device that is inserted into the urethra.  Injecting collagen or carbon beads (bulking agents) into the urinary sphincter. These can help thicken tissue and close the bladder opening.  Surgery. Follow these instructions at home: Lifestyle  Limit alcohol and caffeine. These can fill your bladder quickly and irritate it.  Keep yourself clean to help prevent odors and skin damage. Ask your doctor about special skin creams and cleansers that can protect the skin from urine.  Consider wearing pads or adult diapers. Make sure to change them regularly, and always change them right after experiencing incontinence. General instructions  Take over-the-counter and prescription medicines only as told  by your health care provider.  Use the bathroom about every 3-4 hours, even if you do not feel the need to urinate. Try to empty your bladder completely every time. After urinating, wait a minute. Then try to urinate again.  Make sure you are in a relaxed position while urinating.  If your incontinence is caused by nerve problems, keep a log of the medicines you take and the times you go to the bathroom.  Keep all follow-up visits as told by your health care provider. This is important. Contact a health care provider if:  You have pain that gets worse.  Your incontinence gets worse. Get help right away if:  You have a fever or chills.  You are unable to urinate.  You have redness in your groin area or down your legs. Summary  Urinary incontinence refers to a condition in which a person is unable to control where and when to pass urine.  This condition may be caused by medicines, infection, weak bladder muscles, weak pelvic floor muscles, enlargement of the prostate (in men), or surgery.  The following factors increase your risk for developing this condition: older age, obesity, pregnancy and childbirth,  menopause, neurological diseases, and chronic coughing.  There are several types of urinary incontinence. They include urge incontinence, stress incontinence, overflow incontinence, and functional incontinence.  This condition is usually treated first with lifestyle and behavioral changes, such as quitting smoking, eating a healthier diet, and doing regular pelvic floor exercises. Other treatment options include medicines, bulking agents, medical devices, electrical nerve stimulation, or surgery. This information is not intended to replace advice given to you by your health care provider. Make sure you discuss any questions you have with your health care provider. Document Released: 10/22/2004 Document Revised: 12/24/2016 Document Reviewed: 12/24/2016 Elsevier Interactive Patient  Education  2019 [employer].  Kegel Exercises Kegel exercises help strengthen the muscles that support the rectum, vagina, small intestine, bladder, and uterus. Doing Kegel exercises can help: Improve bladder and bowel control. Improve sexual response. Reduce problems and discomfort during pregnancy. Kegel exercises involve squeezing your pelvic floor muscles, which are the same muscles you squeeze when you try to stop the flow of urine. The exercises can be done while sitting, standing, or lying down, but it is best to vary your position. Exercises Squeeze your pelvic floor muscles tight. You should feel a tight lift in your rectal area. If you are a female, you should also feel a tightness in your vaginal area. Keep your stomach, buttocks, and legs relaxed. Hold the muscles tight for up to 10 seconds. Relax your muscles. Repeat this exercise 50 times a day or as many times as told by your health care provider. Continue to do this exercise for at least 4-6 weeks or for as long as told by your health care provider. This information is not intended to replace advice given to you by your health care provider. Make sure you discuss any questions you have with your health care provider. Document Released: 08/31/2012 Document Revised: 01/25/2017 Document Reviewed: 08/04/2015 Elsevier Interactive Patient Education  2019 [employer].
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
## 2018-09-19 LAB — CYTOLOGY - PAP
Diagnosis: NEGATIVE
HPV (WINDOPATH): NOT DETECTED

## 2018-10-13 DIAGNOSIS — N939 Abnormal uterine and vaginal bleeding, unspecified: Secondary | ICD-10-CM

## 2018-10-13 HISTORY — DX: Abnormal uterine and vaginal bleeding, unspecified: N93.9

## 2018-10-18 NOTE — Anesthesia Preprocedure Evaluation (Addendum)
Anesthesia Evaluation  Patient identified by MRN, date of birth, ID band Patient awake    Reviewed: Allergy & Precautions, NPO status , Patient's Chart, lab work & pertinent test results  History of Anesthesia Complications Negative for: history of anesthetic complications  Airway Mallampati: II  TM Distance: >3 FB Neck ROM: Full    Dental  (+) Teeth Intact, Dental Advisory Given   Pulmonary former smoker,    Pulmonary exam normal breath sounds clear to auscultation       Cardiovascular negative cardio ROS Normal cardiovascular exam Rhythm:Regular Rate:Normal     Neuro/Psych negative neurological ROS     GI/Hepatic negative GI ROS, Neg liver ROS,   Endo/Other  negative endocrine ROS  Renal/GU negative Renal ROS     Musculoskeletal negative musculoskeletal ROS (+)   Abdominal   Peds  Hematology negative hematology ROS (+)   Anesthesia Other Findings Day of surgery medications reviewed with the patient.  Reproductive/Obstetrics Abnormal uterine bleeding                            Anesthesia Physical Anesthesia Plan  ASA: II  Anesthesia Plan: General   Post-op Pain Management:    Induction: Intravenous  PONV Risk Score and Plan: 3 and Treatment may vary due to age or medical condition, Ondansetron, Dexamethasone and Midazolam  Airway Management Planned: LMA  Additional Equipment:   Intra-op Plan:   Post-operative Plan: Extubation in OR  Informed Consent: I have reviewed the patients History and Physical, chart, labs and discussed the procedure including the risks, benefits and alternatives for the proposed anesthesia with the patient or authorized representative who has indicated his/her understanding and acceptance.     Dental advisory given  Plan Discussed with: CRNA  Anesthesia Plan Comments:        Anesthesia Quick Evaluation

## 2018-10-19 ENCOUNTER — Encounter (HOSPITAL_COMMUNITY): Payer: Self-pay

## 2018-10-19 ENCOUNTER — Other Ambulatory Visit: Payer: Self-pay

## 2018-10-19 ENCOUNTER — Ambulatory Visit (HOSPITAL_COMMUNITY): Payer: No Typology Code available for payment source | Admitting: Anesthesiology

## 2018-10-19 ENCOUNTER — Encounter (HOSPITAL_COMMUNITY)
Admission: RE | Disposition: A | Discharge status: Home / Self Care | Payer: Self-pay | Source: Home / Self Care | Attending: Obstetrics & Gynecology

## 2018-10-19 ENCOUNTER — Ambulatory Visit (HOSPITAL_COMMUNITY)
Admission: RE | Admit: 2018-10-19 | Discharge: 2018-10-19 | Disposition: A | Discharge status: Home / Self Care | Payer: No Typology Code available for payment source | Attending: Obstetrics & Gynecology | Admitting: Obstetrics & Gynecology

## 2018-10-19 DIAGNOSIS — Z87891 Personal history of nicotine dependence: Secondary | ICD-10-CM | POA: Insufficient documentation

## 2018-10-19 DIAGNOSIS — Z79899 Other long term (current) drug therapy: Secondary | ICD-10-CM | POA: Insufficient documentation

## 2018-10-19 DIAGNOSIS — N939 Abnormal uterine and vaginal bleeding, unspecified: Secondary | ICD-10-CM | POA: Diagnosis present

## 2018-10-19 HISTORY — PX: DILITATION & CURRETTAGE/HYSTROSCOPY WITH NOVASURE ABLATION: SHX5568

## 2018-10-19 LAB — CBC
HEMATOCRIT: 40.5 % (ref 36.0–46.0)
HEMOGLOBIN: 13.3 g/dL (ref 12.0–15.0)
MCH: 29 pg (ref 26.0–34.0)
MCHC: 32.8 g/dL (ref 30.0–36.0)
MCV: 88.4 fL (ref 80.0–100.0)
Platelets: 263 10*3/uL (ref 150–400)
RBC: 4.58 MIL/uL (ref 3.87–5.11)
RDW: 13.4 % (ref 11.5–15.5)
WBC: 4.7 10*3/uL (ref 4.0–10.5)
nRBC: 0 % (ref 0.0–0.2)

## 2018-10-19 LAB — PREGNANCY, URINE: Preg Test, Ur: NEGATIVE

## 2018-10-19 SURGERY — DILATATION & CURETTAGE/HYSTEROSCOPY WITH NOVASURE ABLATION
Anesthesia: General

## 2018-10-19 MED ORDER — LIDOCAINE HCL (CARDIAC) PF 100 MG/5ML IV SOSY
PREFILLED_SYRINGE | INTRAVENOUS | Status: DC | PRN
  Administered 2018-10-19: 30 mg via INTRAVENOUS

## 2018-10-19 MED ORDER — GLYCOPYRROLATE 0.2 MG/ML IJ SOLN
INTRAMUSCULAR | Status: DC | PRN
  Administered 2018-10-19: 0.1 mg via INTRAVENOUS

## 2018-10-19 MED ORDER — KETOROLAC TROMETHAMINE 30 MG/ML IJ SOLN
INTRAMUSCULAR | Status: DC | PRN
  Administered 2018-10-19: 30 mg via INTRAVENOUS

## 2018-10-19 MED ORDER — FENTANYL CITRATE (PF) 250 MCG/5ML IJ SOLN
INTRAMUSCULAR | Status: DC | PRN
  Administered 2018-10-19: 50 ug via INTRAVENOUS
  Administered 2018-10-19: 100 ug via INTRAVENOUS

## 2018-10-19 MED ORDER — PROPOFOL 10 MG/ML IV BOLUS
INTRAVENOUS | Status: DC | PRN
  Administered 2018-10-19: 150 mg via INTRAVENOUS

## 2018-10-19 MED ORDER — FLUMAZENIL 0.5 MG/5ML IV SOLN
INTRAVENOUS | Status: DC | PRN
  Administered 2018-10-19: 0.2 mg via INTRAVENOUS

## 2018-10-19 MED ORDER — ONDANSETRON HCL 4 MG/2ML IJ SOLN
INTRAMUSCULAR | Status: DC | PRN
  Administered 2018-10-19: 4 mg via INTRAVENOUS

## 2018-10-19 MED ORDER — LACTATED RINGERS IV SOLN
INTRAVENOUS | Status: DC
  Administered 2018-10-19 (×2): via INTRAVENOUS

## 2018-10-19 MED ORDER — DEXAMETHASONE SODIUM PHOSPHATE 10 MG/ML IJ SOLN
INTRAMUSCULAR | Status: AC
  Filled 2018-10-19: qty 1

## 2018-10-19 MED ORDER — ONDANSETRON HCL 4 MG/2ML IJ SOLN
INTRAMUSCULAR | Status: AC
  Filled 2018-10-19: qty 2

## 2018-10-19 MED ORDER — FENTANYL CITRATE (PF) 250 MCG/5ML IJ SOLN
INTRAMUSCULAR | Status: AC
  Filled 2018-10-19: qty 5

## 2018-10-19 MED ORDER — BUPIVACAINE HCL (PF) 0.5 % IJ SOLN
INTRAMUSCULAR | Status: DC | PRN
  Administered 2018-10-19: 10 mL

## 2018-10-19 MED ORDER — DEXAMETHASONE SODIUM PHOSPHATE 4 MG/ML IJ SOLN
INTRAMUSCULAR | Status: DC | PRN
  Administered 2018-10-19: 10 mg via INTRAVENOUS

## 2018-10-19 MED ORDER — MIDAZOLAM HCL 2 MG/2ML IJ SOLN
INTRAMUSCULAR | Status: AC
  Filled 2018-10-19: qty 2

## 2018-10-19 MED ORDER — PROPOFOL 10 MG/ML IV BOLUS
INTRAVENOUS | Status: AC
  Filled 2018-10-19: qty 20

## 2018-10-19 MED ORDER — SCOPOLAMINE 1 MG/3DAYS TD PT72
MEDICATED_PATCH | TRANSDERMAL | Status: AC
  Filled 2018-10-19: qty 1

## 2018-10-19 MED ORDER — SODIUM CHLORIDE 0.9 % IR SOLN
Status: DC | PRN
  Administered 2018-10-19: 3000 mL

## 2018-10-19 MED ORDER — MIDAZOLAM HCL 2 MG/2ML IJ SOLN
INTRAMUSCULAR | Status: DC | PRN
  Administered 2018-10-19: 2 mg via INTRAVENOUS

## 2018-10-19 MED ORDER — SCOPOLAMINE 1 MG/3DAYS TD PT72
1.0000 | MEDICATED_PATCH | Freq: Once | TRANSDERMAL | Status: DC
  Administered 2018-10-19: 1.5 mg via TRANSDERMAL

## 2018-10-19 MED ORDER — LIDOCAINE HCL (CARDIAC) PF 100 MG/5ML IV SOSY
PREFILLED_SYRINGE | INTRAVENOUS | Status: AC
  Filled 2018-10-19: qty 5

## 2018-10-19 MED ORDER — IBUPROFEN 600 MG PO TABS: 600.0000 mg | ORAL_TABLET | Freq: Four times a day (QID) | ORAL | 0 refills | Status: DC | PRN

## 2018-10-19 SURGICAL SUPPLY — 12 items
ABLATOR SURESOUND NOVASURE (ABLATOR) ×3 IMPLANT
CATH ROBINSON RED A/P 16FR (CATHETERS) ×3 IMPLANT
GLOVE BIO SURGEON STRL SZ7 (GLOVE) ×3 IMPLANT
GLOVE BIOGEL PI IND STRL 7.0 (GLOVE) ×2 IMPLANT
GLOVE BIOGEL PI INDICATOR 7.0 (GLOVE) ×4
GOWN STRL REUS W/TWL LRG LVL3 (GOWN DISPOSABLE) ×6 IMPLANT
GOWN STRL REUS W/TWL XL LVL3 (GOWN DISPOSABLE) ×3 IMPLANT
HIBICLENS CHG 4% 4OZ BTL (MISCELLANEOUS) ×3 IMPLANT
KIT PROCEDURE FLUENT (KITS) ×3 IMPLANT
PACK VAGINAL MINOR WOMEN LF (CUSTOM PROCEDURE TRAY) ×3 IMPLANT
PAD OB MATERNITY 4.3X12.25 (PERSONAL CARE ITEMS) ×3 IMPLANT
TOWEL OR 17X24 6PK STRL BLUE (TOWEL DISPOSABLE) ×6 IMPLANT

## 2018-10-19 NOTE — Anesthesia Postprocedure Evaluation (Signed)
Anesthesia Post Note  Patient: Tonya Duran  Procedure(s) Performed: DILATATION & CURETTAGE/HYSTEROSCOPY WITH NOVASURE ABLATION (N/A )     Patient location during evaluation: PACU Anesthesia Type: General Level of consciousness: awake and alert Pain management: pain level controlled Vital Signs Assessment: post-procedure vital signs reviewed and stable Respiratory status: spontaneous breathing, nonlabored ventilation and respiratory function stable Cardiovascular status: blood pressure returned to baseline and stable Postop Assessment: no apparent nausea or vomiting Anesthetic complications: no    Last Vitals:  Vitals:   10/19/18 1400 10/19/18 1415  BP: 106/62 105/67  Pulse: 61 68  Resp:    Temp:  36.8 C  SpO2: 97% 98%    Last Pain:  Vitals:   10/19/18 1415  TempSrc:   PainSc: 0-No pain   Pain Goal: Patients Stated Pain Goal: 4 (10/19/18 1415)                 Brennan Bailey
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
## 2018-10-19 NOTE — Transfer of Care (Signed)
Immediate Anesthesia Transfer of Care Note  Patient: Tonya Duran  Procedure(s) Performed: DILATATION & CURETTAGE/HYSTEROSCOPY WITH NOVASURE ABLATION (N/A )  Patient Location: PACU  Anesthesia Type:General  Level of Consciousness: awake, alert  and oriented  Airway & Oxygen Therapy: Patient Spontanous Breathing and Patient connected to nasal cannula oxygen  Post-op Assessment: Report given to RN and Post -op Vital signs reviewed and stable  Post vital signs: Reviewed and stable  Last Vitals:  Vitals Value Taken Time  BP    Temp    Pulse 60 10/19/2018 12:55 PM  Resp 9 10/19/2018 12:55 PM  SpO2 100 % 10/19/2018 12:55 PM  Vitals shown include unvalidated device data.  Last Pain:  Vitals:   10/19/18 1049  TempSrc: Oral  PainSc: 1       Patients Stated Pain Goal: 4 (65/68/12 7517)  Complications: No apparent anesthesia complications

## 2018-10-19 NOTE — H&P (Signed)
Preoperative History and Physical  Tonya Duran is a 48 y.o. [phone] here for surgical management of AUB.   Proposed surgery: hysteroscopy with endometrial ablation using Novasure.   History reviewed. No pertinent past medical history. Past Surgical History:  Procedure Laterality Date  . dilatation and curettage  2001, 2016  . LEEP  1995  . WISDOM TOOTH EXTRACTION     OB History    Gravida  4   Para  3   Term  3   Preterm      AB  1   Living  3     SAB  1   TAB      Ectopic      Multiple      Live Births  3          Patient denies any cervical dysplasia or STIs. Medications Prior to Admission  Medication Sig Dispense Refill Last Dose  . sertraline (ZOLOFT) 25 MG tablet Take 1 tablet (25 mg total) by mouth daily. (Patient taking differently: Take 25 mg by mouth See admin instructions. Pt taking only 14 days before menstruation) 14 tablet 3 Past Month at Unknown time    No Known Allergies Social History:   reports that she quit smoking about 25 years ago. She smoked 0.00 packs per day for 0.00 years. She has never used smokeless tobacco. She reports that she does not drink alcohol or use drugs. Family History  Problem Relation Age of Onset  . Lung cancer Mother        Living  . Arthritis Mother   . Lung cancer Father 18       Deceased  . Alzheimer's disease Maternal Grandmother   . Diabetes Paternal Uncle   . Healthy Brother        half-brother  . Healthy Sister        half-sister  . Asthma Son   . Allergies Son   . Healthy Daughter        x1  . Healthy Son        #2  . Sudden death Neg Hx   . Hypertension Neg Hx   . Hyperlipidemia Neg Hx   . Heart attack Neg Hx     Review of Systems: Noncontributory  PHYSICAL EXAM: Blood pressure 114/74, pulse 63, temperature 98.1 F (36.7 C), temperature source Oral, resp. rate 16, height 5\' 3"  (1.6 m), weight 65.8 kg, SpO2 100 %. General appearance - alert, well appearing, and in no distress Chest  - clear to auscultation, no wheezes, rales or rhonchi, symmetric air entry Heart - normal rate and regular rhythm Abdomen - soft, nontender, nondistended, no masses or organomegaly Pelvic - examination not indicated Extremities - peripheral pulses normal, no pedal edema, no clubbing or cyanosis  Labs: Results for orders placed or performed during the hospital encounter of 10/19/18 (from the past 336 hour(s))  Pregnancy, urine   Collection Time: 10/19/18 10:41 AM  Result Value Ref Range   Preg Test, Ur NEGATIVE NEGATIVE  CBC   Collection Time: 10/19/18 10:49 AM  Result Value Ref Range   WBC 4.7 4.0 - 10.5 K/uL   RBC 4.58 3.87 - 5.11 MIL/uL   Hemoglobin 13.3 12.0 - 15.0 g/dL   HCT 40.5 36.0 - 46.0 %   MCV 88.4 80.0 - 100.0 fL   MCH 29.0 26.0 - 34.0 pg   MCHC 32.8 30.0 - 36.0 g/dL   RDW 13.4 11.5 - 15.5 %   Platelets 263 150 -  400 K/uL   nRBC 0.0 0.0 - 0.2 %    Imaging Studies: No results found.  Assessment: Patient Active Problem List   Diagnosis Date Noted  . Abnormal uterine bleeding (AUB) 10/13/2018  . Right hip pain 03/05/2014  . Breast cancer screening 03/02/2014  . Visit for preventive health examination 03/02/2014  . ETD (eustachian tube dysfunction) 03/02/2014  . Right groin pain 02/23/2014  . Right foot pain 06/19/2012    Plan: Patient will undergo surgical management with hysteroscopy with endometrial ablation.   The risks of surgery were discussed in detail with the patient including but not limited to: bleeding which may require transfusion or reoperation; infection which may require antibiotics; injury to surrounding organs which may involve bowel, bladder, ureters ; need for additional procedures including laparoscopy or laparotomy; thromboembolic phenomenon, surgical site problems and other postoperative/anesthesia complications. Likelihood of success in alleviating the patient's condition was discussed. Routine postoperative instructions will be reviewed with  the patient and her family in detail after surgery.  The patient concurred with the proposed plan, giving informed written consent for the surgery.  Patient has been NPO since last night she will remain NPO for procedure.  Anesthesia and OR aware.  Preoperative prophylactic antibiotics and SCDs ordered on call to the OR.  To OR when ready.  Parrie Rasco L. Harraway-Smith, M.D., [facility] 10/19/2018 11:08 AM

## 2018-10-19 NOTE — Anesthesia Procedure Notes (Signed)
Procedure Name: LMA Insertion Date/Time: 10/19/2018 12:07 PM Performed by: Alvy Bimler, CRNA Pre-anesthesia Checklist: Patient identified, Patient being monitored, Emergency Drugs available, Timeout performed and Suction available Patient Re-evaluated:Patient Re-evaluated prior to induction Oxygen Delivery Method: Circle System Utilized Preoxygenation: Pre-oxygenation with 100% oxygen Induction Type: IV induction Ventilation: Mask ventilation without difficulty LMA: LMA inserted LMA Size: 4.0 Number of attempts: 1 Placement Confirmation: positive ETCO2 and breath sounds checked- equal and bilateral

## 2018-10-19 NOTE — Brief Op Note (Signed)
10/19/2018  1:47 PM  PATIENT:  Tonya Duran  48 y.o. female  PRE-OPERATIVE DIAGNOSIS:  AUB  POST-OPERATIVE DIAGNOSIS:  AUB  PROCEDURE:  Procedure(s): DILATATION & CURETTAGE/HYSTEROSCOPY WITH NOVASURE ABLATION (N/A)  SURGEON:  Surgeon(s) and Role:    * Lavonia Drafts, MD - Primary  ANESTHESIA:   general and paracervical block  EBL:  7 mL   BLOOD ADMINISTERED:none  DRAINS: none   LOCAL MEDICATIONS USED:  MARCAINE     SPECIMEN:  Source of Specimen:  endometrial currettage   DISPOSITION OF SPECIMEN:  PATHOLOGY  COUNTS:  YES  TOURNIQUET:  * No tourniquets in log *  DICTATION: .Note written in EPIC  PLAN OF CARE: Discharge to home after PACU  PATIENT DISPOSITION:  PACU - hemodynamically stable.   Delay start of Pharmacological VTE agent (>24hrs) due to surgical blood loss or risk of bleeding: not applicable  Complications: none immediate  Maize Brittingham L. Harraway-Smith, M.D., [name]

## 2018-10-19 NOTE — Discharge Instructions (Signed)
DISCHARGE INSTRUCTIONS: HYSTEROSCOPY / ENDOMETRIAL ABLATION The following instructions have been prepared to help you care for yourself upon your return home.  May Remove Scop patch on or before  May take Ibuprofen after  May take stool softner while taking narcotic pain medication to prevent constipation.  Drink plenty of water.  Personal hygiene:  Use sanitary pads for vaginal drainage, not tampons.  Shower the day after your procedure.  NO tub baths, pools or Jacuzzis for 2-3 weeks.  Wipe front to back after using the bathroom.  Activity and limitations:  Do NOT drive or operate any equipment for 24 hours. The effects of anesthesia are still present and drowsiness may result.  Do NOT rest in bed all day.  Walking is encouraged.  Walk up and down stairs slowly.  You may resume your normal activity in one to two days or as indicated by your physician. Sexual activity: NO intercourse for at least 2 weeks after the procedure, or as indicated by your Doctor.  Diet: Eat a light meal as desired this evening. You may resume your usual diet tomorrow.  Return to Work: You may resume your work activities in one to two days or as indicated by Marine scientist.  What to expect after your surgery: Expect to have vaginal bleeding/discharge for 2-3 days and spotting for up to 10 days. It is not unusual to have soreness for up to 1-2 weeks. You may have a slight burning sensation when you urinate for the first day. Mild cramps may continue for a couple of days. You may have a regular period in 2-6 weeks.  Call your doctor for any of the following:  Excessive vaginal bleeding or clotting, saturating and changing one pad every hour.  Inability to urinate 6 hours after discharge from hospital.  Pain not relieved by pain medication.  Fever of 100.4 F or greater.  Unusual vaginal discharge or odor.  Return to office _________________Call for an appointment  ___________________ Patients signature: ______________________ Nurses signature ________________________  Post Anesthesia Care Unit [phone] Post Anesthesia Home Care Instructions  Activity: Get plenty of rest for the remainder of the day. A responsible individual must stay with you for 24 hours following the procedure.  For the next 24 hours, DO NOT: -Drive a car -Paediatric nurse -Drink alcoholic beverages -Take any medication unless instructed by your physician -Make any legal decisions or sign important papers.  Meals: Start with liquid foods such as gelatin or soup. Progress to regular foods as tolerated. Avoid greasy, spicy, heavy foods. If nausea and/or vomiting occur, drink only clear liquids until the nausea and/or vomiting subsides. Call your physician if vomiting continues.  Special Instructions/Symptoms: Your throat may feel dry or sore from the anesthesia or the breathing tube placed in your throat during surgery. If this causes discomfort, gargle with warm salt water. The discomfort should disappear within 24 hours.  If you had a scopolamine patch placed behind your ear for the management of post- operative nausea and/or vomiting:  1. The medication in the patch is effective for 72 hours, after which it should be removed.  Wrap patch in a tissue and discard in the trash. Wash hands thoroughly with soap and water. 2. You may remove the patch earlier than 72 hours if you experience unpleasant side effects which may include dry mouth, dizziness or visual disturbances. 3. Avoid touching the patch. Wash your hands with soap and water after contact with the patch.

## 2018-10-20 ENCOUNTER — Encounter (HOSPITAL_COMMUNITY): Payer: Self-pay | Admitting: Obstetrics & Gynecology

## 2018-11-03 ENCOUNTER — Telehealth: Payer: Self-pay

## 2018-11-03 NOTE — Telephone Encounter (Signed)
Patient called stated that she thinks she may have infection. Patient denies any fever at this time but is having some odorous discharge. Patient offered appointment tomorrow morning with Dr. March Rummage RN

## 2018-11-04 ENCOUNTER — Encounter: Payer: Self-pay | Admitting: Obstetrics & Gynecology

## 2018-11-04 ENCOUNTER — Ambulatory Visit (INDEPENDENT_AMBULATORY_CARE_PROVIDER_SITE_OTHER): Payer: No Typology Code available for payment source | Admitting: Obstetrics & Gynecology

## 2018-11-04 VITALS — BP 115/72 | HR 68 | Ht 63.0 in

## 2018-11-04 DIAGNOSIS — Z9889 Other specified postprocedural states: Secondary | ICD-10-CM | POA: Diagnosis not present

## 2018-11-04 DIAGNOSIS — B9689 Other specified bacterial agents as the cause of diseases classified elsewhere: Secondary | ICD-10-CM

## 2018-11-04 DIAGNOSIS — N760 Acute vaginitis: Secondary | ICD-10-CM

## 2018-11-04 MED ORDER — METRONIDAZOLE 500 MG PO TABS: 500.0000 mg | ORAL_TABLET | Freq: Two times a day (BID) | ORAL | 0 refills | Status: DC

## 2018-11-04 NOTE — Progress Notes (Signed)
History:  48 y.o. [id number] here today for 2 week post op check. Pt c/o order and sl  bleeding. She is s/p endometrial ablation. She reports that the odor smells bad like trash.   The following portions of the patient's history were reviewed and updated as appropriate: allergies, current medications, past family history, past medical history, past social history, past surgical history and problem list.  Review of Systems:  Pertinent items are noted in HPI.    Objective:  Physical Exam BP 115/72   Pulse 68   Ht 5\' 3"  (1.6 m)   LMP 10/30/2018 (Exact Date)   BMI 24.98 kg/m    CONSTITUTIONAL: Well-developed, well-nourished female in no acute distress.  HENT:  Normocephalic, atraumatic EYES: Conjunctivae and EOM are normal. No scleral icterus.  NECK: Normal range of motion SKIN: Skin is warm and dry. No rash noted. Not diaphoretic.No pallor. [place]: Alert and oriented to person, place, and time. Normal coordination.  Abd: Soft, nontender and nondistended Pelvic: deferred  Labs and Imaging 10/19/2018 Diagnosis Endometrium, curettage - BENIGN SECRETORY-TYPE ENDOMETRIUM. - THERE IS NO EVIDENCE OF HYPERPLASIA OR MALIGNANCY.  Assessment & Plan:  2 week post op check s/p endometrial ablation  Pt is doing well post op- no surgical complications  F/u in 3 months to review AUB   BV  Flagyl 500mg  bid x 7 days  F/u prior to 3 months if sx do not improve.  Nakkia Mackiewicz L. Harraway-Smith, M.D., [name]

## 2018-11-04 NOTE — Patient Instructions (Signed)
Bacterial Vaginosis    Bacterial vaginosis is a vaginal infection that occurs when the normal balance of bacteria in the vagina is disrupted. It results from an overgrowth of certain bacteria. This is the most common vaginal infection among women ages 15-44.  Because bacterial vaginosis increases your risk for STIs (sexually transmitted infections), getting treated can help reduce your risk for chlamydia, gonorrhea, herpes, and HIV (human immunodeficiency virus). Treatment is also important for preventing complications in pregnant women, because this condition can cause an early (premature) delivery.  What are the causes?  This condition is caused by an increase in harmful bacteria that are normally present in small amounts in the vagina. However, the reason that the condition develops is not fully understood.  What increases the risk?  The following factors may make you more likely to develop this condition:  · Having a new sexual partner or multiple sexual partners.  · Having unprotected sex.  · Douching.  · Having an intrauterine device (IUD).  · Smoking.  · Drug and alcohol abuse.  · Taking certain antibiotic medicines.  · Being pregnant.  You cannot get bacterial vaginosis from toilet seats, bedding, swimming pools, or contact with objects around you.  What are the signs or symptoms?  Symptoms of this condition include:  · Grey or white vaginal discharge. The discharge can also be watery or foamy.  · A fish-like odor with discharge, especially after sexual intercourse or during menstruation.  · Itching in and around the vagina.  · Burning or pain with urination.  Some women with bacterial vaginosis have no signs or symptoms.  How is this diagnosed?  This condition is diagnosed based on:  · Your medical history.  · A physical exam of the vagina.  · Testing a sample of vaginal fluid under a microscope to look for a large amount of bad bacteria or abnormal cells. Your health care provider may use a cotton swab or  a small wooden spatula to collect the sample.  How is this treated?  This condition is treated with antibiotics. These may be given as a pill, a vaginal cream, or a medicine that is put into the vagina (suppository). If the condition comes back after treatment, a second round of antibiotics may be needed.  Follow these instructions at home:  Medicines  · Take over-the-counter and prescription medicines only as told by your health care provider.  · Take or use your antibiotic as told by your health care provider. Do not stop taking or using the antibiotic even if you start to feel better.  General instructions  · If you have a female sexual partner, tell her that you have a vaginal infection. She should see her health care provider and be treated if she has symptoms. If you have a female sexual partner, he does not need treatment.  · During treatment:  ? Avoid sexual activity until you finish treatment.  ? Do not douche.  ? Avoid alcohol as directed by your health care provider.  ? Avoid breastfeeding as directed by your health care provider.  · Drink enough water and fluids to keep your urine clear or pale yellow.  · Keep the area around your vagina and rectum clean.  ? Wash the area daily with warm water.  ? Wipe yourself from front to back after using the toilet.  · Keep all follow-up visits as told by your health care provider. This is important.  How is this prevented?  · Do not   douche.  · Wash the outside of your vagina with warm water only.  · Use protection when having sex. This includes latex condoms and dental dams.  · Limit how many sexual partners you have. To help prevent bacterial vaginosis, it is best to have sex with just one partner (monogamous).  · Make sure you and your sexual partner are tested for STIs.  · Wear cotton or cotton-lined underwear.  · Avoid wearing tight pants and pantyhose, especially during summer.  · Limit the amount of alcohol that you drink.  · Do not use any products that contain  nicotine or tobacco, such as cigarettes and e-cigarettes. If you need help quitting, ask your health care provider.  · Do not use illegal drugs.  Where to find more information  · Centers for Disease Control and Prevention: www.cdc.gov/std  · American Sexual Health Association (ASHA): www.ashastd.org  · U.S. Department of Health and Human Services, Office on Women's Health: www.womenshealth.gov/ or https://www.womenshealth.gov/a-z-topics/bacterial-vaginosis  Contact a health care provider if:  · Your symptoms do not improve, even after treatment.  · You have more discharge or pain when urinating.  · You have a fever.  · You have pain in your abdomen.  · You have pain during sex.  · You have vaginal bleeding between periods.  Summary  · Bacterial vaginosis is a vaginal infection that occurs when the normal balance of bacteria in the vagina is disrupted.  · Because bacterial vaginosis increases your risk for STIs (sexually transmitted infections), getting treated can help reduce your risk for chlamydia, gonorrhea, herpes, and HIV (human immunodeficiency virus). Treatment is also important for preventing complications in pregnant women, because the condition can cause an early (premature) delivery.  · This condition is treated with antibiotic medicines. These may be given as a pill, a vaginal cream, or a medicine that is put into the vagina (suppository).  This information is not intended to replace advice given to you by your health care provider. Make sure you discuss any questions you have with your health care provider.  Document Released: 09/14/2005 Document Revised: 01/18/2017 Document Reviewed: 05/30/2016  Elsevier Interactive Patient Education © 2019 Elsevier Inc.

## 2018-11-07 ENCOUNTER — Telehealth: Payer: Self-pay

## 2018-11-07 NOTE — Telephone Encounter (Signed)
Patient called stating that she is still feeling itching. Patient is on day 3 of her flagyl dose- informed her symptoms should start to decrease in next day or two. Patient also concerned that she is still having bleeding- patient is 2.5 weeks postop abalation- informed her bleeding at this point is still normal for post op state. Kathrene Alu RN

## 2018-11-18 ENCOUNTER — Encounter: Payer: No Typology Code available for payment source | Admitting: Obstetrics & Gynecology

## 2019-06-21 ENCOUNTER — Encounter: Payer: Self-pay | Admitting: Family Medicine

## 2019-06-21 ENCOUNTER — Ambulatory Visit: Payer: No Typology Code available for payment source | Admitting: Family Medicine

## 2019-06-21 ENCOUNTER — Other Ambulatory Visit: Payer: Self-pay

## 2019-06-21 VITALS — BP 122/80 | HR 71 | Temp 97.3°F | Ht 63.0 in | Wt 162.4 lb

## 2019-06-21 DIAGNOSIS — Z23 Encounter for immunization: Secondary | ICD-10-CM | POA: Diagnosis not present

## 2019-06-21 DIAGNOSIS — Z0000 Encounter for general adult medical examination without abnormal findings: Secondary | ICD-10-CM | POA: Diagnosis not present

## 2019-06-21 DIAGNOSIS — Z1239 Encounter for other screening for malignant neoplasm of breast: Secondary | ICD-10-CM | POA: Diagnosis not present

## 2019-06-21 DIAGNOSIS — Z1283 Encounter for screening for malignant neoplasm of skin: Secondary | ICD-10-CM

## 2019-06-21 NOTE — Progress Notes (Signed)
Chief Complaint  Patient presents with  . New Patient (Initial Visit)    establsih care//vertigo and dry skin     Well Woman Tonya Duran is here for a complete physical.   Her last physical was >1 year ago.  Current diet: in general, a "healthy" diet. Current exercise: walking, was doing HIIT. Weight is increasing and she denies daytime fatigue. No LMP recorded.  Seatbelt? Yes   Health Maintenance Pap/HPV- Yes Mammogram- No Tetanus- No HIV screening- Yes  History reviewed. No pertinent past medical history.   Past Surgical History:  Procedure Laterality Date  . dilatation and curettage  2001, 2016  . DILITATION & CURRETTAGE/HYSTROSCOPY WITH NOVASURE ABLATION N/A 10/19/2018   Procedure: DILATATION & CURETTAGE/HYSTEROSCOPY WITH NOVASURE ABLATION;  Surgeon: Lavonia Drafts, MD;  Location: [facility];  Service: Gynecology;  Laterality: N/A;  . LEEP  1995  . WISDOM TOOTH EXTRACTION      Medications  Current Outpatient Medications on File Prior to Visit  Medication Sig Dispense Refill  . ibuprofen (ADVIL,MOTRIN) 600 MG tablet Take 1 tablet (600 mg total) by mouth every 6 (six) hours as needed. (Patient not taking: Reported on 06/21/2019) 20 tablet 0   Allergies No Known Allergies  Review of Systems: Constitutional:  no unexpected weight changes Eye:  no recent significant change in vision Ear/Nose/Mouth/Throat:  Ears:  no tinnitus or vertigo and no recent change in hearing Nose/Mouth/Throat:  no complaints of nasal congestion, no sore throat Cardiovascular: no chest pain Respiratory:  no cough and no shortness of breath Gastrointestinal:  no abdominal pain, no change in bowel habits GU:  Female: negative for dysuria or pelvic pain Musculoskeletal/Extremities:  no pain of the joints Integumentary (Skin/Breast): +dry patches on face; otherwise no abnormal skin lesions reported Neurologic:  no headaches, no current vertigo Endocrine:  denies  fatigue Hematologic/Lymphatic:  No areas of easy bleeding  Exam BP 122/80 (BP Location: Left Arm, Patient Position: Sitting, Cuff Size: Normal)   Pulse 71   Temp (!) 97.3 F (36.3 C) (Temporal)   Ht 5\' 3"  (1.6 m)   Wt 162 lb 6 oz (73.7 kg)   SpO2 96%   BMI 28.76 kg/m  General:  well developed, well nourished, in no apparent distress Skin: Scaly patches on face; otherwise no significant moles, warts, or growths Head:  no masses, lesions, or tenderness Eyes:  pupils equal and round, sclera anicteric without injection Ears:  canals without lesions, TMs shiny without retraction, no obvious effusion, no erythema Nose:  nares patent, septum midline, mucosa normal, and no drainage or sinus tenderness Throat/Pharynx:  lips and gingiva without lesion; tongue and uvula midline; non-inflamed pharynx; no exudates or postnasal drainage Neck: neck supple without adenopathy, thyromegaly, or masses Lungs:  clear to auscultation, breath sounds equal bilaterally, no respiratory distress Cardio:  regular rate and rhythm, no bruits, no LE edema Abdomen:  abdomen soft, nontender; bowel sounds normal; no masses or organomegaly Genital: Defer to GYN Musculoskeletal:  symmetrical muscle groups noted without atrophy or deformity Extremities:  no clubbing, cyanosis, or edema, no deformities, no skin discoloration Neuro:  gait normal; deep tendon reflexes normal and symmetric Psych: well oriented with normal range of affect and appropriate judgment/insight  Assessment and Plan  Well adult exam - Plan: CBC, Comprehensive metabolic panel, Lipid panel  Screening for breast cancer - Plan: MM DIGITAL SCREENING BILATERAL  Skin cancer screening - Plan: Ambulatory referral to Dermatology  Need for influenza vaccination - Plan: Flu Vaccine QUAD 6+ mos PF IM (  Fluarix Quad PF)  Need for tetanus booster - Plan: Tdap vaccine greater than or equal to 74yo IM   Well 48 y.o. female. Counseled on diet and  exercise. Other orders as above. Follow up in 1 yr. The patient voiced understanding and agreement to the plan.  [place], DO 06/21/19 4:16 PM

## 2019-06-21 NOTE — Patient Instructions (Signed)
If you do not hear anything about your referral in the next 1-2 weeks, call our office and ask for an update.  Give us 2-3 business days to get the results of your labs back.   Keep the diet clean and stay active.  Let us know if you need anything.  

## 2019-06-22 LAB — CBC
HCT: 38.5 % (ref 36.0–46.0)
Hemoglobin: 12.7 g/dL (ref 12.0–15.0)
MCHC: 33.1 g/dL (ref 30.0–36.0)
MCV: 86.3 fl (ref 78.0–100.0)
Platelets: 343 10*3/uL (ref 150.0–400.0)
RBC: 4.46 Mil/uL (ref 3.87–5.11)
RDW: 13.1 % (ref 11.5–15.5)
WBC: 7.5 10*3/uL (ref 4.0–10.5)

## 2019-06-22 LAB — LIPID PANEL
Cholesterol: 172 mg/dL (ref 0–200)
HDL: 72.9 mg/dL (ref >39.00)
LDL Cholesterol: 89 mg/dL (ref 0–99)
NonHDL: 98.81
Total CHOL/HDL Ratio: 2
Triglycerides: 51 mg/dL (ref 0.0–149.0)
VLDL: 10.2 mg/dL (ref 0.0–40.0)

## 2019-06-22 LAB — COMPREHENSIVE METABOLIC PANEL
ALT: 17 U/L (ref 0–35)
AST: 20 U/L (ref 0–37)
Albumin: 4.3 g/dL (ref 3.5–5.2)
Alkaline Phosphatase: 55 U/L (ref 39–117)
BUN: 21 mg/dL (ref 6–23)
CO2: 28 mEq/L (ref 19–32)
Calcium: 9.1 mg/dL (ref 8.4–10.5)
Chloride: 101 mEq/L (ref 96–112)
Creatinine, Ser: 0.67 mg/dL (ref 0.40–1.20)
GFR: 93.74 mL/min (ref >60.00)
Glucose, Bld: 91 mg/dL (ref 70–99)
Potassium: 4.2 mEq/L (ref 3.5–5.1)
Sodium: 136 mEq/L (ref 135–145)
Total Bilirubin: 0.3 mg/dL (ref 0.2–1.2)
Total Protein: 6.9 g/dL (ref 6.0–8.3)

## 2019-08-02 ENCOUNTER — Ambulatory Visit: Payer: No Typology Code available for payment source | Admitting: Family Medicine

## 2019-08-02 ENCOUNTER — Other Ambulatory Visit: Payer: Self-pay

## 2019-08-02 ENCOUNTER — Encounter: Payer: Self-pay | Admitting: Family Medicine

## 2019-08-02 VITALS — BP 102/68 | HR 85 | Temp 97.8°F | Ht 63.0 in | Wt 164.5 lb

## 2019-08-02 DIAGNOSIS — E663 Overweight: Secondary | ICD-10-CM

## 2019-08-02 DIAGNOSIS — M545 Low back pain, unspecified: Secondary | ICD-10-CM

## 2019-08-02 DIAGNOSIS — G8929 Other chronic pain: Secondary | ICD-10-CM

## 2019-08-02 HISTORY — DX: Other chronic pain: G89.29

## 2019-08-02 HISTORY — DX: Low back pain, unspecified: M54.50

## 2019-08-02 MED ORDER — MELOXICAM 15 MG PO TABS: 15.0000 mg | ORAL_TABLET | Freq: Every day | ORAL | 0 refills | Status: DC | PRN

## 2019-08-02 NOTE — Patient Instructions (Addendum)

## 2019-08-02 NOTE — Progress Notes (Signed)
Musculoskeletal Exam  Patient: Tonya Duran DOB: 07-12-1971  DOS: 08/02/2019  SUBJECTIVE:  Chief Complaint:   Chief Complaint  Patient presents with  . Back Pain    Tonya Duran is a 48 y.o.  female for evaluation and treatment of her back pain.   Onset:  3 months ago. No inj or change in activity.  Had gained weight.  Location: lower Character:  aching and dull; worse in certain positions Progression of issue:  has worsened over past month Associated symptoms: No bruising, swelling or redness Denies bowel/bladder incontinence or weakness Treatment: to date has been stretching.   Neurovascular symptoms:Toe is numb, R great toe Has gained >20 lbs since pandemic and physical activity has sharply declined.   ROS: Musculoskeletal/Extremities: +back pain Neurologic: no numbness, tingling no weakness   Past Medical History:  Diagnosis Date  . No known health problems     Objective:  VITAL SIGNS: BP 102/68 (BP Location: Left Arm, Patient Position: Sitting, Cuff Size: Normal)   Pulse 85   Temp 97.8 F (36.6 C) (Temporal)   Ht 5\' 3"  (1.6 m)   Wt 164 lb 8 oz (74.6 kg)   SpO2 98%   BMI 29.14 kg/m  Constitutional: Well formed, well developed. No acute distress. HENT: Normocephalic, atraumatic.  Thorax & Lungs:  No accessory muscle use Skin: Warm. Dry. No erythema. No rash.  Musculoskeletal: low back.   Tenderness to palpation: no Deformity: no Ecchymosis: no Straight leg test: negative for Poor hamstring flexibility b/l. Neurologic: Normal sensory function. No focal deficits noted. DTR's equal and symmetric in LE's. No clonus. Psychiatric: Normal mood. Age appropriate judgment and insight. Alert & oriented x 3.    Assessment:  Chronic bilateral low back pain without sciatica - Plan: meloxicam (MOBIC) 15 MG tablet  Overweight (BMI 25.0-29.9)  Plan: Orders as above. Stretches/exercises, heat, ice, Tylenol, NSAIDs. Counseled on ideal protein intake and  strategies to lose weight.  If no improvement over next 3-4 weeks, will set up with PT.  F/u prn. The patient voiced understanding and agreement to the plan.   [place], DO 08/02/19  4:28 PM

## 2019-09-19 ENCOUNTER — Other Ambulatory Visit: Payer: Self-pay | Admitting: Family Medicine

## 2019-09-19 DIAGNOSIS — G8929 Other chronic pain: Secondary | ICD-10-CM

## 2019-10-19 ENCOUNTER — Other Ambulatory Visit: Payer: Self-pay | Admitting: Family Medicine

## 2019-10-19 DIAGNOSIS — G8929 Other chronic pain: Secondary | ICD-10-CM

## 2019-10-19 MED ORDER — MELOXICAM 15 MG PO TABS: ORAL_TABLET | ORAL | 0 refills | Status: DC

## 2019-11-02 ENCOUNTER — Encounter: Payer: Self-pay | Admitting: Family Medicine

## 2019-12-05 ENCOUNTER — Other Ambulatory Visit: Payer: Self-pay

## 2019-12-06 ENCOUNTER — Other Ambulatory Visit: Payer: Self-pay

## 2019-12-06 ENCOUNTER — Ambulatory Visit: Payer: No Typology Code available for payment source | Admitting: Family Medicine

## 2019-12-06 ENCOUNTER — Encounter: Payer: Self-pay | Admitting: Family Medicine

## 2019-12-06 VITALS — BP 118/76 | HR 69 | Temp 97.5°F | Ht 63.0 in | Wt 170.5 lb

## 2019-12-06 DIAGNOSIS — G8929 Other chronic pain: Secondary | ICD-10-CM

## 2019-12-06 DIAGNOSIS — M545 Low back pain, unspecified: Secondary | ICD-10-CM

## 2019-12-06 DIAGNOSIS — R41840 Attention and concentration deficit: Secondary | ICD-10-CM | POA: Diagnosis not present

## 2019-12-06 MED ORDER — NITROGLYCERIN 0.2 MG/HR TD PT24: MEDICATED_PATCH | TRANSDERMAL | 1 refills | Status: DC

## 2019-12-06 NOTE — Progress Notes (Signed)
Chief Complaint  Patient presents with  . Follow-up    problems focusing  . Back Pain    Subjective: Patient is a 49 y.o. female here for f.u back pain.  F/u midline back pain. No improvement. Meloxicam helps, but it never goes away. Stiff as well. Tried stretches/exercises. Heat, Tylenol unhelpful. No bowel/bladder incontinence. No neurologic s/s's.   +famhx of ADD. Son placed on Focalin and is doing quite well. She has issues with inattention. Has never been evaluated. Seems to have started when she was a young child.   ROS: MSK: +back pain Neuro: no weakness  Past Medical History:  Diagnosis Date  . No known health problems     Objective: BP 118/76 (BP Location: Left Arm, Patient Position: Sitting, Cuff Size: Normal)   Pulse 69   Temp (!) 97.5 F (36.4 C) (Temporal)   Ht 5\' 3"  (1.6 m)   Wt 170 lb 8 oz (77.3 kg)   SpO2 99%   BMI 30.20 kg/m  General: Awake, appears stated age HEENT: MMM, EOMi MSK: No ttp in lumbar; +hamstring tightness b/l Neuro: DTR's equal and symmetric, no clonus, gait nml, no cerebellar signs Lungs: No accessory muscle use Psych: Age appropriate judgment and insight, normal affect and mood  Assessment and Plan: Chronic midline low back pain without sciatica - Plan: Ambulatory referral to Physical Therapy  Inattention - Plan: Ambulatory referral to Psychology  1- Refer PT. Nitro patches. Cont heat, ice, Tylenol, Mobic. 2- Refer for eval.  F/u in 8 weeks to reck back.  The patient voiced understanding and agreement to the plan.  [place], DO 12/06/19  8:58 AM

## 2019-12-06 NOTE — Patient Instructions (Signed)
If you do not hear anything about your referral in the next 1-2 weeks, call our office and ask for an update.  Heat (pad or rice pillow in microwave) over affected area, 10-15 minutes twice daily.   Try taking half a tab of the meloxicam if you have to take it during the day.   Let us know if you need anything.

## 2019-12-20 ENCOUNTER — Ambulatory Visit: Payer: No Typology Code available for payment source | Attending: Family Medicine | Admitting: Physical Therapy

## 2019-12-20 ENCOUNTER — Other Ambulatory Visit: Payer: Self-pay

## 2019-12-20 ENCOUNTER — Encounter: Payer: Self-pay | Admitting: Physical Therapy

## 2019-12-20 DIAGNOSIS — M545 Low back pain, unspecified: Secondary | ICD-10-CM

## 2019-12-20 DIAGNOSIS — R29898 Other symptoms and signs involving the musculoskeletal system: Secondary | ICD-10-CM | POA: Diagnosis present

## 2019-12-20 DIAGNOSIS — G8929 Other chronic pain: Secondary | ICD-10-CM

## 2019-12-20 NOTE — Therapy (Addendum)
[facility] [address]  [place], [place], [address] Phone: [phone]   Fax:  [phone]  Physical Therapy Evaluation  Patient Details  Name: Tonya Duran MRN: [id number] Date of Birth: 11-May-1971 Referring Provider (PT): Riki Sheer, DO   Encounter Date: 12/20/2019  PT End of Session - 12/20/19 1140    Visit Number  1    Number of Visits  5    Date for PT Re-Evaluation  01/17/20    Authorization Type  [employer]    PT Start Time  1052    PT Stop Time  1130    PT Time Calculation (min)  38 min    Activity Tolerance  Patient tolerated treatment well    Behavior During Therapy  [facility] for tasks assessed/performed       Past Medical History:  Diagnosis Date  . No known health problems     Past Surgical History:  Procedure Laterality Date  . dilatation and curettage  2001, 2016  . DILITATION & CURRETTAGE/HYSTROSCOPY WITH NOVASURE ABLATION N/A 10/19/2018   Procedure: DILATATION & CURETTAGE/HYSTEROSCOPY WITH NOVASURE ABLATION;  Surgeon: Lavonia Drafts, MD;  Location: [facility];  Service: Gynecology;  Laterality: N/A;  . LEEP  1995  . WISDOM TOOTH EXTRACTION      There were no vitals filed for this visit.   Subjective Assessment - 12/20/19 1054    Subjective  Patient reports LBP for the past couple years with flares/relapses along the way. Had been active in a fitness program before COVID, and LBP would be increased with deadlifts. In the past year, pain has been aggravated constantly. Pain is located along the center of the spine with radiation to B sides. Denies radiation down LEs, N/T, B&B changes. Pain worse with prolonged sitting, especially when standing from prolonged sitting. No worse with walking. Sometimes worse in AM but varies.    Pertinent History  verigo    Limitations  Sitting;House hold activities    How long can you sit comfortably?  20 min    How long can you stand comfortably?   unlimited    How long can you walk comfortably?  unlimited    Diagnostic tests  none    Patient Stated Goals  "be active and pain-free"    Currently in Pain?  Yes    Pain Score  2     Pain Location  Back    Pain Orientation  Right;Left;Lower    Pain Descriptors / Indicators  Aching;Dull    Pain Type  Chronic pain         [facility] PT Assessment - 12/20/19 1100      Assessment   Medical Diagnosis  Chronic midline LBP without sciatica    Referring Provider (PT)  Riki Sheer, DO    Onset Date/Surgical Date  12/20/18    Next MD Visit  not scheduled    Prior Therapy  no      Balance Screen   Has the patient fallen in the past 6 months  No    Has the patient had a decrease in activity level because of a fear of falling?   No    Is the patient reluctant to leave their home because of a fear of falling?   No      Home Environment   Living Environment  Private residence    Living Arrangements  Spouse/significant other;Children    Available Help at Discharge  Family    Type  of [place] to enter    Entrance Stairs-Number of Steps  Andrews or work area in basement      Prior Function   Level of Independence  Independent    Vocation  Full time employment    Vocation Requirements  working from home    Leisure  workout at home      Cognition   Overall Cognitive Status  Within Functional Limits for tasks assessed      Sensation   Light Touch  Appears Intact      Coordination   Gross Motor Movements are Fluid and Coordinated  Yes      Posture/Postural Control   Posture/Postural Control  Postural limitations    Postural Limitations  Weight shift left;Rounded Shoulders      ROM / Strength   AROM / PROM / Strength  AROM;Strength      AROM   AROM Assessment Site  Lumbar    Lumbar Flexion  toes   dull ache in LB on return up   Lumbar Extension  mildly limited   c/o stiffness but relieving    Lumbar - Right Side Bend  jt line    Lumbar - Left Side Bend  distal thigh   pain in L LB   Lumbar - Right Rotation  WFL    Lumbar - Left Rotation  mildly limited   mild pain in L LP     Strength   Strength Assessment Site  Hip;Knee;Ankle    Right/Left Hip  Right;Left    Right Hip Flexion  5/5    Right Hip Extension  4+/5    Right Hip ABduction  4+/5    Right Hip ADduction  4+/5    Left Hip Flexion  4+/5    Left Hip Extension  4+/5    Left Hip ABduction  4/5    Left Hip ADduction  4/5    Right/Left Knee  Right;Left    Right Knee Flexion  5/5    Right Knee Extension  5/5    Left Knee Flexion  4+/5    Left Knee Extension  5/5    Right/Left Ankle  Right;Left    Right Ankle Dorsiflexion  4+/5    Right Ankle Plantar Flexion  4+/5    Left Ankle Dorsiflexion  4+/5    Left Ankle Plantar Flexion  4+/5      Flexibility   Soft Tissue Assessment /Muscle Length  yes    Hamstrings  mod tight L, WFL R    Quadriceps  mod tight L, WFL R    Piriformis  WFL B      Palpation   Spinal mobility  slightly hypomobile and TTP over L1-4 with central PAs    Palpation comment  no TTP; increased soft tissue restriction in L QL, lumbar paraspinals, piriformis      Ambulation/Gait   Assistive device  None    Gait Pattern  Within Functional Limits    Gait velocity  WNL                Objective measurements completed on examination: See above findings.              PT Education - 12/20/19 1140    Education Details  prognosis, POC, HEP    Person(s) Educated  Patient    Methods  Explanation;Demonstration;Tactile cues;Verbal cues;Handout  Comprehension  Verbalized understanding;Returned demonstration       PT Short Term Goals - 12/20/19 1145      PT SHORT TERM GOAL #1   Title  Patient to be independent with inital HEP.    Time  2    Period  Weeks    Status  New    Target Date  01/03/20        PT Long Term Goals - 12/20/19 1145      PT LONG TERM GOAL #1   Title   Patient to be independent with advanced HEP.    Time  4    Period  Weeks    Status  New    Target Date  01/17/20      PT LONG TERM GOAL #2   Title  Patient to demonstrate lumbar AROM WFL and without pain limiting.    Time  4    Period  Weeks    Status  New    Target Date  01/17/20      PT LONG TERM GOAL #3   Title  Patient to demonstrate L hip strength >/=4+/5.    Time  4    Period  Weeks    Status  New    Target Date  01/17/20      PT LONG TERM GOAL #4   Title  Patient to report 75% improvement in pain levels when standing from prolonged sitting.    Time  4    Period  Weeks    Status  New    Target Date  01/17/20      PT LONG TERM GOAL #5   Title  Patient to report return to fitness program with modifications as needed atleast 2x/week.    Time  4    Period  Weeks    Status  New    Target Date  01/17/20             Plan - 12/20/19 1140    Clinical Impression Statement  Patient is a 49y/o F presenting to OPPT with c/o chronic midline LBP radiating to B sides with recent worsening in the past year. Previously this pain was aggravated by deadlifts, now pain is more constant and worse with prolonged sitting and standing from prolonged sitting. Denies radiation down LEs, N/T, B&B changes. Patient admits to being less active since the pandemic, and would like to return to working out without pain. Patient today demonstrating limited lumbar extension and L rotation/sidebending, slightly decreased L hip strength, increased soft tissue restriction over L posterior chain musculature, increased tightness in L HS and quads, and hypomobile and TTP over L1-4. Patient educated on gentle stretching and lumbopelvic ROM HEP- patient reported understanding. Would benefit from skilled PT services 1x/week for 4 weeks to address aforementioned impairments.    Personal Factors and Comorbidities  Age;Fitness;Past/Current Experience;Profession;Time since onset of injury/illness/exacerbation     Examination-Activity Limitations  Sit;Bend;Squat;Stairs;Caring for Others;Carry    Examination-Participation Restrictions  Church;Cleaning;Driving;Yard Work    Stability/Clinical Decision Making  Stable/Uncomplicated    Clinical Decision Making  Low    Rehab Potential  Good    PT Frequency  1x / week    PT Duration  4 weeks    PT Treatment/Interventions  ADLs/Self Care Home Management;Cryotherapy;Electrical Stimulation;Moist Heat;Traction;Therapeutic exercise;Therapeutic activities;Functional mobility training;Stair training;Gait training;Ultrasound;Neuromuscular re-education;Cognitive remediation;Patient/family education;Manual techniques;Taping;Energy conservation;Dry needling;Passive range of motion    PT Next Visit Plan  lumbar FOTO; reassess HEP; STM and lumbar mobs, progress lumbopelvic ROM- extension,  L SB/rotation    Consulted and Agree with Plan of Care  Patient       Patient will benefit from skilled therapeutic intervention in order to improve the following deficits and impairments:  Hypomobility, Decreased activity tolerance, Decreased strength, Pain, Increased muscle spasms, Decreased range of motion, Improper body mechanics, Impaired flexibility  Visit Diagnosis: Chronic midline low back pain without sciatica - Plan: PT plan of care cert/re-cert  Other symptoms and signs involving the musculoskeletal system - Plan: PT plan of care cert/re-cert     Problem List Patient Active Problem List   Diagnosis Date Noted  . Chronic midline low back pain without sciatica 08/02/2019  . Abnormal uterine bleeding (AUB) 10/13/2018  . Right hip pain 03/05/2014  . Breast cancer screening 03/02/2014  . Visit for preventive health examination 03/02/2014  . ETD (eustachian tube dysfunction) 03/02/2014  . Right groin pain 02/23/2014  . Right foot pain 06/19/2012     Janene Harvey, PT, DPT 12/20/19 11:50 AM   [facility] [address]  [place], [place], [address] Phone: [phone]   Fax:  [phone]  Name: Zhanna Baal MRN: [id number] Date of Birth: 1971-06-20

## 2019-12-27 ENCOUNTER — Ambulatory Visit: Payer: No Typology Code available for payment source

## 2019-12-27 ENCOUNTER — Other Ambulatory Visit: Payer: Self-pay

## 2019-12-27 DIAGNOSIS — M545 Low back pain: Secondary | ICD-10-CM | POA: Diagnosis not present

## 2019-12-27 DIAGNOSIS — G8929 Other chronic pain: Secondary | ICD-10-CM

## 2019-12-27 DIAGNOSIS — R29898 Other symptoms and signs involving the musculoskeletal system: Secondary | ICD-10-CM

## 2019-12-27 NOTE — Therapy (Signed)
[facility] [address]  [place], [place], [address] Phone: [phone]   Fax:  [phone]  Physical Therapy Treatment  Patient Details  Name: Tonya Duran MRN: [id number] Date of Birth: 05-24-1971 Referring Provider (PT): Riki Sheer, DO   Encounter Date: 12/27/2019  PT End of Session - 12/27/19 1114    Visit Number  2    Number of Visits  5    Date for PT Re-Evaluation  01/17/20    Authorization Type  [employer]    PT Start Time  1104    PT Stop Time  1155    PT Time Calculation (min)  51 min    Activity Tolerance  Patient tolerated treatment well    Behavior During Therapy  [facility] for tasks assessed/performed       Past Medical History:  Diagnosis Date  . No known health problems     Past Surgical History:  Procedure Laterality Date  . dilatation and curettage  2001, 2016  . DILITATION & CURRETTAGE/HYSTROSCOPY WITH NOVASURE ABLATION N/A 10/19/2018   Procedure: DILATATION & CURETTAGE/HYSTEROSCOPY WITH NOVASURE ABLATION;  Surgeon: Lavonia Drafts, MD;  Location: [facility];  Service: Gynecology;  Laterality: N/A;  . LEEP  1995  . WISDOM TOOTH EXTRACTION      There were no vitals filed for this visit.  Subjective Assessment - 12/27/19 1113    Subjective  Notes she has been performing HEP.    Pertinent History  verigo    Diagnostic tests  none    Patient Stated Goals  "be active and pain-free"    Currently in Pain?  Yes    Pain Score  4     Pain Location  Back    Pain Orientation  Right;Left;Lower    Pain Descriptors / Indicators  Tightness    Pain Type  Chronic pain    Multiple Pain Sites  No         [facility] PT Assessment - 12/27/19 0001      Observation/Other Assessments   Focus on Therapeutic Outcomes (FOTO)   65% (35% limitation)                   [facility] Adult PT Treatment/Exercise - 12/27/19 0001      Lumbar Exercises: Stretches   Single Knee to Chest Stretch   Right;Left;1 rep;30 seconds    Single Knee to Chest Stretch Limitations  other LE straight     Prone Mid Back Stretch  2 reps;30 seconds    Prone Mid Back Stretch Limitations  prone childs pose to R /L    Figure 4 Stretch  1 rep;30 seconds    Figure 4 Stretch Limitations  B supine       Lumbar Exercises: Supine   Pelvic Tilt  10 reps    Pelvic Tilt Limitations  3" hold in hooklying       Lumbar Exercises: Sidelying   Other Sidelying Lumbar Exercises  B "open book" stretch 5" x 10 reps       Lumbar Exercises: Quadruped   Madcat/Old Horse  10 reps    Madcat/Old Horse Limitations  3" hold      Knee/Hip Exercises: Stretches   Passive Hamstring Stretch  Left;2 reps;30 seconds    Passive Hamstring Stretch Limitations  supine with strap     Hip Flexor Stretch  Left;2 reps;30 seconds    Hip Flexor Stretch Limitations  mod thomas stretch with strap  Knee/Hip Exercises: Aerobic   Nustep  Lvl 4, 6 min (UE/LE)      Knee/Hip Exercises: Prone   Other Prone Exercises  prone half press up 3" x 10 reps              PT Education - 12/27/19 1228    Education Details  HEP update;  bridge, "open book" stretch, prone childs pose R/L    Person(s) Educated  Patient    Methods  Explanation;Demonstration;Verbal cues;Handout    Comprehension  Verbalized understanding;Returned demonstration;Verbal cues required       PT Short Term Goals - 12/27/19 1115      PT SHORT TERM GOAL #1   Title  Patient to be independent with inital HEP.    Time  2    Period  Weeks    Status  Achieved    Target Date  01/03/20        PT Long Term Goals - 12/27/19 1115      PT LONG TERM GOAL #1   Title  Patient to be independent with advanced HEP.    Time  4    Period  Weeks    Status  On-going      PT LONG TERM GOAL #2   Title  Patient to demonstrate lumbar AROM WFL and without pain limiting.    Time  4    Period  Weeks    Status  On-going      PT LONG TERM GOAL #3   Title  Patient to  demonstrate L hip strength >/=4+/5.    Time  4    Period  Weeks    Status  On-going      PT LONG TERM GOAL #4   Title  Patient to report 75% improvement in pain levels when standing from prolonged sitting.    Time  4    Period  Weeks    Status  On-going      PT LONG TERM GOAL #5   Title  Patient to report return to fitness program with modifications as needed atleast 2x/week.    Time  4    Period  Weeks    Status  On-going            Plan - 12/27/19 1115    Clinical Impression Statement  Evalie demonstrating good overall recall of initial HEP and able to progress to partial prone press ups today without increased pain.  Just with complaint of lower back "tightness" today during therex intermittently which was relieved with rest.  Tolerated progression of lumbopelvic ROM and core strengthening thus updated HEP with handout issued to pt.  Will plan to progress lumbopelvic strengthening activities per pt. tolerance in coming visits.    Rehab Potential  Good    PT Treatment/Interventions  ADLs/Self Care Home Management;Cryotherapy;Electrical Stimulation;Moist Heat;Traction;Therapeutic exercise;Therapeutic activities;Functional mobility training;Stair training;Gait training;Ultrasound;Neuromuscular re-education;Cognitive remediation;Patient/family education;Manual techniques;Taping;Energy conservation;Dry needling;Passive range of motion    PT Next Visit Plan  STM and lumbar mobs, progress lumbopelvic ROM- extension, L SB/rotation    Consulted and Agree with Plan of Care  Patient       Patient will benefit from skilled therapeutic intervention in order to improve the following deficits and impairments:  Hypomobility, Decreased activity tolerance, Decreased strength, Pain, Increased muscle spasms, Decreased range of motion, Improper body mechanics, Impaired flexibility  Visit Diagnosis: Chronic midline low back pain without sciatica  Other symptoms and signs involving the  musculoskeletal system     Problem List  Patient Active Problem List   Diagnosis Date Noted  . Chronic midline low back pain without sciatica 08/02/2019  . Abnormal uterine bleeding (AUB) 10/13/2018  . Right hip pain 03/05/2014  . Breast cancer screening 03/02/2014  . Visit for preventive health examination 03/02/2014  . ETD (eustachian tube dysfunction) 03/02/2014  . Right groin pain 02/23/2014  . Right foot pain 06/19/2012    Bess Harvest, PTA 12/27/19 12:29 PM   [facility] [address]  [place], [place], [address] Phone: [phone]   Fax:  [phone]  Name: Tonya Duran MRN: [id number] Date of Birth: [birth year]

## 2020-01-03 ENCOUNTER — Ambulatory Visit: Payer: No Typology Code available for payment source | Attending: Family Medicine | Admitting: Physical Therapy

## 2020-01-03 ENCOUNTER — Other Ambulatory Visit: Payer: Self-pay

## 2020-01-03 ENCOUNTER — Encounter: Payer: Self-pay | Admitting: Physical Therapy

## 2020-01-03 DIAGNOSIS — G8929 Other chronic pain: Secondary | ICD-10-CM

## 2020-01-03 DIAGNOSIS — M545 Low back pain: Secondary | ICD-10-CM | POA: Insufficient documentation

## 2020-01-03 DIAGNOSIS — R29898 Other symptoms and signs involving the musculoskeletal system: Secondary | ICD-10-CM | POA: Diagnosis present

## 2020-01-03 NOTE — Therapy (Signed)
[facility] [address]  [place], [place], [address] Phone: [phone]   Fax:  [phone]  Physical Therapy Treatment  Patient Details  Name: Tonya Duran MRN: [id number] Date of Birth: 10-Aug-1971 Referring Provider (PT): Riki Sheer, DO   Encounter Date: 01/03/2020  PT End of Session - 01/03/20 1146    Visit Number  3    Number of Visits  5    Date for PT Re-Evaluation  01/17/20    Authorization Type  Med[employer]    PT Start Time  1107    PT Stop Time  1145    PT Time Calculation (min)  38 min    Activity Tolerance  Patient tolerated treatment well;Patient limited by pain    Behavior During Therapy  [facility] for tasks assessed/performed       Past Medical History:  Diagnosis Date  . No known health problems     Past Surgical History:  Procedure Laterality Date  . dilatation and curettage  2001, 2016  . DILITATION & CURRETTAGE/HYSTROSCOPY WITH NOVASURE ABLATION N/A 10/19/2018   Procedure: DILATATION & CURETTAGE/HYSTEROSCOPY WITH NOVASURE ABLATION;  Surgeon: Lavonia Drafts, MD;  Location: [facility];  Service: Gynecology;  Laterality: N/A;  . LEEP  1995  . WISDOM TOOTH EXTRACTION      There were no vitals filed for this visit.  Subjective Assessment - 01/03/20 1109    Subjective  Has been working on [employer] and eating better and believes this has improved her ability to move. Was able to lift bags of dirt at [facility] without pain.    Pertinent History  vertigo    Diagnostic tests  none    Patient Stated Goals  "be active and pain-free"    Currently in Pain?  No/denies                       [facility] Adult PT Treatment/Exercise - 01/03/20 0001      Exercises   Exercises  Lumbar      Lumbar Exercises: Stretches   Passive Hamstring Stretch  Left;30 seconds;Right;1 rep    Passive Hamstring Stretch Limitations  supine with strap       Lumbar Exercises: Aerobic   Recumbent Bike  L2 x  29min      Lumbar Exercises: Standing   Other Standing Lumbar Exercises  thoracic extension at wall 2x 10x3"      Lumbar Exercises: Supine   Other Supine Lumbar Exercises  thoracic extension over foam roll x10 to tolerance   cues to decreased ROM d/t pain      Lumbar Exercises: Prone   Other Prone Lumbar Exercises  prone press up on hands x10      Lumbar Exercises: Quadruped   Madcat/Old Horse  10 reps    Madcat/Old Horse Limitations  3" hold   cues for rhythmic movement   Other Quadruped Lumbar Exercises  thread the needle x10 each side   limited ROM     Knee/Hip Exercises: Stretches   Hip Flexor Stretch  Left;30 seconds;Right;1 rep    Hip Flexor Stretch Limitations  mod thomas stretch with strap       Manual Therapy   Manual Therapy  Joint mobilization;Soft tissue mobilization;Myofascial release    Manual therapy comments  prone    Joint Mobilization  central PAs L1-5 and L unilateral PAs L1-5; most tenderness and hypomobility L1,2,4 grade III/IV   good relief from unilateral PAs   Soft  tissue mobilization  STM to L proximal and lateral glutes, piriformis, QL- TTP throughout but especially in lateral glutes    Myofascial Release  manual TPR to L piriformis and lateral glutes             PT Education - 01/03/20 1146    Education Details  update to HEP    Person(s) Educated  Patient    Methods  Explanation;Demonstration;Tactile cues;Verbal cues;Handout    Comprehension  Returned demonstration;Verbalized understanding       PT Short Term Goals - 12/27/19 1115      PT SHORT TERM GOAL #1   Title  Patient to be independent with inital HEP.    Time  2    Period  Weeks    Status  Achieved    Target Date  01/03/20        PT Long Term Goals - 12/27/19 1115      PT LONG TERM GOAL #1   Title  Patient to be independent with advanced HEP.    Time  4    Period  Weeks    Status  On-going      PT LONG TERM GOAL #2   Title  Patient to demonstrate lumbar AROM WFL and  without pain limiting.    Time  4    Period  Weeks    Status  On-going      PT LONG TERM GOAL #3   Title  Patient to demonstrate L hip strength >/=4+/5.    Time  4    Period  Weeks    Status  On-going      PT LONG TERM GOAL #4   Title  Patient to report 75% improvement in pain levels when standing from prolonged sitting.    Time  4    Period  Weeks    Status  On-going      PT LONG TERM GOAL #5   Title  Patient to report return to fitness program with modifications as needed atleast 2x/week.    Time  4    Period  Weeks    Status  On-going            Plan - 01/03/20 1147    Clinical Impression Statement  Patient reporting improvement in LBP since performing HEP and eating better. Was able to lift bags of dirt recently without problems. Patient tolerated central and L unilateral PAs to lumbar spine for improvement in hypomobility and pain. Addressed soft tissue restriction and trigger points in L QL, proximal and lateral glutes, and piriformis with MT as well. Worked on lumbar mobility ther-ex with patient demonstrating remaining stiffness and movement limitation and lumbar and thoracic extension as well as rotation. Did report intense stretch and mild pain with thoracic extension over foam roll which was relieved with change in position as well as decreasing ROM. Updated HEP with exercises that were well-tolerated today. Patient reported mild LBP at end of session but declined modalities. Plan to progress per POC.    Rehab Potential  Good    PT Treatment/Interventions  ADLs/Self Care Home Management;Cryotherapy;Electrical Stimulation;Moist Heat;Traction;Therapeutic exercise;Therapeutic activities;Functional mobility training;Stair training;Gait training;Ultrasound;Neuromuscular re-education;Cognitive remediation;Patient/family education;Manual techniques;Taping;Energy conservation;Dry needling;Passive range of motion    PT Next Visit Plan  STM and lumbar mobs, progress lumbopelvic  ROM- extension, L SB/rotation    Consulted and Agree with Plan of Care  Patient       Patient will benefit from skilled therapeutic intervention in order to improve the following  deficits and impairments:  Hypomobility, Decreased activity tolerance, Decreased strength, Pain, Increased muscle spasms, Decreased range of motion, Improper body mechanics, Impaired flexibility  Visit Diagnosis: Chronic midline low back pain without sciatica  Other symptoms and signs involving the musculoskeletal system     Problem List Patient Active Problem List   Diagnosis Date Noted  . Chronic midline low back pain without sciatica 08/02/2019  . Abnormal uterine bleeding (AUB) 10/13/2018  . Right hip pain 03/05/2014  . Breast cancer screening 03/02/2014  . Visit for preventive health examination 03/02/2014  . ETD (eustachian tube dysfunction) 03/02/2014  . Right groin pain 02/23/2014  . Right foot pain 06/19/2012     Janene Harvey, PT, DPT 01/03/20 11:51 AM   [facility] [address]  [place], [place], [address] Phone: [phone]   Fax:  [phone]  Name: Tonya Duran MRN: [id number] Date of Birth: 1971/05/05

## 2020-01-10 ENCOUNTER — Ambulatory Visit: Payer: No Typology Code available for payment source

## 2020-01-10 ENCOUNTER — Other Ambulatory Visit: Payer: Self-pay

## 2020-01-10 DIAGNOSIS — G8929 Other chronic pain: Secondary | ICD-10-CM

## 2020-01-10 DIAGNOSIS — M545 Low back pain, unspecified: Secondary | ICD-10-CM

## 2020-01-10 DIAGNOSIS — R29898 Other symptoms and signs involving the musculoskeletal system: Secondary | ICD-10-CM

## 2020-01-10 NOTE — Therapy (Signed)
[facility] [address]  [place], [place], [address] Phone: [phone]   Fax:  [phone]  Physical Therapy Treatment  Patient Details  Name: Tonya Duran MRN: [id number] Date of Birth: 1971-05-23 Referring Provider (PT): Riki Sheer, [place]   Encounter Date: 01/10/2020  PT End of Session - 01/10/20 1111    Visit Number  4    Number of Visits  5    Date for PT Re-Evaluation  01/17/20    Authorization Type  Med[employer]    PT Start Time  1107    PT Stop Time  1212    PT Time Calculation (min)  65 min    Activity Tolerance  Patient tolerated treatment well;Patient limited by pain    Behavior During Therapy  [facility] for tasks assessed/performed       Past Medical History:  Diagnosis Date  . No known health problems     Past Surgical History:  Procedure Laterality Date  . dilatation and curettage  2001, 2016  . DILITATION & CURRETTAGE/HYSTROSCOPY WITH NOVASURE ABLATION N/A 10/19/2018   Procedure: DILATATION & CURETTAGE/HYSTEROSCOPY WITH NOVASURE ABLATION;  Surgeon: Lavonia Drafts, MD;  Location: [facility];  Service: Gynecology;  Laterality: N/A;  . LEEP  1995  . WISDOM TOOTH EXTRACTION      There were no vitals filed for this visit.  Subjective Assessment - 01/10/20 1107    Subjective  Doing well.  Denies issues with HEP.    Pertinent History  vertigo    Diagnostic tests  none    Patient Stated Goals  "be active and pain-free"    Currently in Pain?  Yes    Pain Score  2     Pain Location  Back    Pain Orientation  Right;Left;Lower    Pain Descriptors / Indicators  Tightness;Burning    Pain Type  Chronic pain    Aggravating Factors   bending down    Multiple Pain Sites  No         [facility] PT Assessment - 01/10/20 0001      Strength   Strength Assessment Site  Hip;Knee;Ankle    Right/Left Hip  Right;Left    Right Hip Flexion  5/5    Right Hip Extension  5/5    Right Hip External Rotation    5/5    Right Hip Internal Rotation  5/5    Right Hip ABduction  4+/5    Right Hip ADduction  4+/5    Left Hip Flexion  4+/5    Left Hip Extension  4+/5    Left Hip External Rotation  4+/5    Left Hip Internal Rotation  5/5    Left Hip ABduction  4+/5    Left Hip ADduction  4/5    Right/Left Knee  Right;Left    Right Knee Flexion  5/5    Right Knee Extension  5/5    Left Knee Flexion  5/5    Left Knee Extension  5/5    Right/Left Ankle  Right;Left    Right Ankle Dorsiflexion  4+/5    Right Ankle Plantar Flexion  5/5    Left Ankle Dorsiflexion  4+/5    Left Ankle Plantar Flexion  4+/5                   [facility] Adult PT Treatment/Exercise - 01/10/20 0001      Self-Care   Self-Care  Other Self-Care Comments  Other Self-Care Comments   Instruction in proper setup and use along with handout for Home TENS unit 7000 2nd edition       Lumbar Exercises: Aerobic   Recumbent Bike  L2 x 27mn      Lumbar Exercises: Sidelying   Other Sidelying Lumbar Exercises  B "open book" stretch 5" x 10 reps       Lumbar Exercises: Quadruped   Madcat/Old Horse  10 reps    Madcat/Old Horse Limitations  3" hold    Opposite Arm/Leg Raise  Right arm/Left leg;Left arm/Right leg;10 reps;3 seconds    Opposite Arm/Leg Raise Limitations  quadruped     Other Quadruped Lumbar Exercises  thread the needle x10 each side      Knee/Hip Exercises: Supine   Bridges with Ball Squeeze  Both;15 reps;2 sets   3" hold      Modalities   Modalities  Electrical Stimulation;Moist Heat      Moist Heat Therapy   Number Minutes Moist Heat  15 Minutes    Moist Heat Location  Lumbar Spine      Electrical Stimulation   Electrical Stimulation Location  lumbar spine     Electrical Stimulation Action  IFC    Electrical Stimulation Parameters  80-150Hz, intensity to pt. tolerance, 15'    Electrical Stimulation Goals  Pain;Tone             PT Education - 01/10/20 1208    Education Details  Education  on Home TENS unit;  bridge/adduction ball squeeze HEP update    Person(s) Educated  Patient    Methods  Explanation;Demonstration;Tactile cues;Handout    Comprehension  Verbalized understanding;Returned demonstration;Verbal cues required       PT Short Term Goals - 12/27/19 1115      PT SHORT TERM GOAL #1   Title  Patient to be independent with inital HEP.    Time  2    Period  Weeks    Status  Achieved    Target Date  01/03/20        PT Long Term Goals - 01/10/20 1123      PT LONG TERM GOAL #1   Title  Patient to be independent with advanced HEP.    Time  4    Period  Weeks    Status  Partially Met   met for current     PT LONG TERM GOAL #2   Title  Patient to demonstrate lumbar AROM WFL and without pain limiting.    Time  4    Period  Weeks    Status  On-going      PT LONG TERM GOAL #3   Title  Patient to demonstrate L hip strength >/=4+/5.    Time  4    Period  Weeks    Status  Partially Met   01/10/20:  met with exception of L hip adduction strength 4/5     PT LONG TERM GOAL #4   Title  Patient to report 75% improvement in pain levels when standing from prolonged sitting.    Time  4    Period  Weeks    Status  Partially Met   01/10/20:  50-60%     PT LONG TERM GOAL #5   Title  Patient to report return to fitness program with modifications as needed atleast 2x/week.    Time  4    Period  Weeks    Status  On-going  Plan - 01/10/20 1111    Clinical Impression Statement  Pt. reporting she has returned to regular [employer] biking at home however has not returned to bootcamp classes and unsure if she wants to.  Pt. able to progress toward LTG #3 partially meeting L hip strength goal with exception of L hip adduction strength 4/5 remaining.  HEP updated to address remaining weakness.  Pt. noting 50-60% improvement in back pain when standing up from prolonged sitting position.  LTG #4 partially achieved.  Tolerated progression of lumbopelvic  strengthening activities well without notable pain increase.  Progressing well toward remaining LTGs.    Rehab Potential  Good    PT Treatment/Interventions  ADLs/Self Care Home Management;Cryotherapy;Electrical Stimulation;Moist Heat;Traction;Therapeutic exercise;Therapeutic activities;Functional mobility training;Stair training;Gait training;Ultrasound;Neuromuscular re-education;Cognitive remediation;Patient/family education;Manual techniques;Taping;Energy conservation;Dry needling;Passive range of motion    PT Next Visit Plan  STM and lumbar mobs, progress lumbopelvic ROM- extension, L SB/rotation    Consulted and Agree with Plan of Care  Patient       Patient will benefit from skilled therapeutic intervention in order to improve the following deficits and impairments:  Hypomobility, Decreased activity tolerance, Decreased strength, Pain, Increased muscle spasms, Decreased range of motion, Improper body mechanics, Impaired flexibility  Visit Diagnosis: Chronic midline low back pain without sciatica  Other symptoms and signs involving the musculoskeletal system     Problem List Patient Active Problem List   Diagnosis Date Noted  . Chronic midline low back pain without sciatica 08/02/2019  . Abnormal uterine bleeding (AUB) 10/13/2018  . Right hip pain 03/05/2014  . Breast cancer screening 03/02/2014  . Visit for preventive health examination 03/02/2014  . ETD (eustachian tube dysfunction) 03/02/2014  . Right groin pain 02/23/2014  . Right foot pain 06/19/2012    Bess Harvest, PTA 01/10/20 12:21 PM   [facility] [address]  [place], [place], [address] Phone: [phone]   Fax:  [phone]  Name: Tonya Duran MRN: [id number] Date of Birth: [birth year]

## 2020-01-10 NOTE — Patient Instructions (Addendum)
TENS stands for Transcutaneous Electrical Nerve Stimulation. In other words, electrical impulses are allowed to pass through the skin in order to excite a nerve.   Purpose and Use of TENS:  TENS is a method used to manage acute and chronic pain without the use of drugs. It has been effective in managing pain associated with surgery, sprains, strains, trauma, rheumatoid arthritis, and neuralgias. It is a non-addictive, low risk, and non-invasive technique used to control pain. It is not, by any means, a curative form of treatment.   How TENS Works:  Most TENS units are a Paramedic unit powered by one 9 volt battery. Attached to the outside of the unit are two lead wires where two pins and/or snaps connect on each wire. All units come with a set of four reusable pads or electrodes. These are placed on the skin surrounding the area involved. By inserting the leads into  the pads, the electricity can pass from the unit making the circuit complete.  As the intensity is turned up slowly, the electrical current enters the body from the electrodes through the skin to the surrounding nerve fibers. This triggers the release of hormones from within the body. These hormones contain pain relievers. By increasing the circulation of these hormones, the person's pain may be lessened. It is also believed that the electrical stimulation itself helps to block the pain messages being sent to the brain, thus also decreasing the body's perception of pain.   Hazards:  TENS units are NOT to be used by patients with PACEMAKERS, DEFIBRILLATORS, DIABETIC PUMPS, PREGNANT WOMEN, and patients with SEIZURE DISORDERS.  TENS units are NOT to be used over the heart, throat, brain, or spinal cord.  One of the major side effects from the TENS unit may be skin irritation. Some people may develop a rash if they are sensitive to the materials used in the electrodes or the connecting wires.   Wear the unit for 15-30 min .   Avoid  overuse due the body getting used to the stem making it not as effective over time.   TENS UNIT  This is helpful for muscle pain and spasm.   Search and Purchase a TENS 7000 2nd edition at www.tenspros.com or www.amazon.com  (It should be less than $30)     TENS unit instructions:   Do not shower or bathe with the unit on  Turn the unit off before removing electrodes or batteries  If the electrodes lose stickiness add a drop of water to the electrodes after they are disconnected from the unit and place on plastic sheet. If you continued to have difficulty, call the TENS unit company to purchase more electrodes.  Do not apply lotion on the skin area prior to use. Make sure the skin is clean and dry as this will help prolong the life of the electrodes.  After use, always check skin for unusual red areas, rash or other skin difficulties. If there are any skin problems, does not apply electrodes to the same area.  Never remove the electrodes from the unit by pulling the wires.  Do not use the TENS unit or electrodes other than as directed.  Do not change electrode placement without consulting your therapist or physician.  Keep 2 fingers with between each electrode.

## 2020-01-17 ENCOUNTER — Ambulatory Visit: Payer: No Typology Code available for payment source | Admitting: Physical Therapy

## 2020-01-24 ENCOUNTER — Ambulatory Visit: Payer: No Typology Code available for payment source

## 2020-01-31 ENCOUNTER — Ambulatory Visit: Payer: No Typology Code available for payment source | Attending: Family Medicine | Admitting: Physical Therapy

## 2020-01-31 ENCOUNTER — Other Ambulatory Visit: Payer: Self-pay

## 2020-01-31 ENCOUNTER — Encounter: Payer: Self-pay | Admitting: Physical Therapy

## 2020-01-31 DIAGNOSIS — G8929 Other chronic pain: Secondary | ICD-10-CM | POA: Diagnosis present

## 2020-01-31 DIAGNOSIS — R29898 Other symptoms and signs involving the musculoskeletal system: Secondary | ICD-10-CM | POA: Insufficient documentation

## 2020-01-31 DIAGNOSIS — M545 Low back pain: Secondary | ICD-10-CM | POA: Diagnosis present

## 2020-01-31 NOTE — Therapy (Addendum)
[facility] [address]  [place], [place], [address] Phone: [phone]   Fax:  [phone]  Physical Therapy Treatment  Patient Details  Name: Tonya Duran MRN: [id number] Date of Birth: Apr 04, 1971 Referring Provider (PT): Riki Sheer, DO   Encounter Date: 01/31/2020  PT End of Session - 01/31/20 1141    Visit Number  5    Number of Visits  5    Date for PT Re-Evaluation  01/17/20    Authorization Type  [employer]    PT Start Time  1102    PT Stop Time  1138    PT Time Calculation (min)  36 min    Activity Tolerance  Patient tolerated treatment well;Patient limited by pain    Behavior During Therapy  [facility] for tasks assessed/performed       Past Medical History:  Diagnosis Date  . No known health problems     Past Surgical History:  Procedure Laterality Date  . dilatation and curettage  2001, 2016  . DILITATION & CURRETTAGE/HYSTROSCOPY WITH NOVASURE ABLATION N/A 10/19/2018   Procedure: DILATATION & CURETTAGE/HYSTEROSCOPY WITH NOVASURE ABLATION;  Surgeon: Lavonia Drafts, MD;  Location: [facility];  Service: Gynecology;  Laterality: N/A;  . LEEP  1995  . WISDOM TOOTH EXTRACTION      There were no vitals filed for this visit.  Subjective Assessment - 01/31/20 1103    Subjective  Has been working on her HEP and feels that "slowly but surely it has been getting better."    Pertinent History  vertigo    Diagnostic tests  none    Patient Stated Goals  "be active and pain-free"    Currently in Pain?  No/denies         [facility] PT Assessment - 01/31/20 0001      Observation/Other Assessments   Focus on Therapeutic Outcomes (FOTO)   72% (28% limitation)      AROM   Lumbar Flexion  floor   mild LBP on return up   Lumbar Extension  mildly limited   mild LBP   Lumbar - Right Side Bend  jt line    Lumbar - Left Side Bend  distal thigh   mild LBP   Lumbar - Right Rotation  WFL    Lumbar - Left  Rotation  mildly limited   c/o stiffness     Strength   Right Hip Flexion  5/5    Right Hip Extension  4/5    Right Hip External Rotation   5/5    Right Hip Internal Rotation  5/5    Right Hip ABduction  5/5    Right Hip ADduction  4+/5    Left Hip Flexion  4+/5    Left Hip Extension  4/5    Left Hip External Rotation  5/5    Left Hip Internal Rotation  5/5    Left Hip ABduction  4+/5    Left Hip ADduction  4+/5    Right Knee Flexion  5/5    Right Knee Extension  5/5    Left Knee Flexion  4+/5    Left Knee Extension  5/5    Right Ankle Dorsiflexion  5/5    Right Ankle Plantar Flexion  5/5    Left Ankle Dorsiflexion  5/5    Left Ankle Plantar Flexion  5/5                   [facility] Adult PT  Treatment/Exercise - 01/31/20 0001      Lumbar Exercises: Aerobic   Recumbent Bike  L2 x 13mn      Lumbar Exercises: Standing   Other Standing Lumbar Exercises  standing open book stretch against wall x5 each side   cueing for form     Knee/Hip Exercises: Supine   Single Leg Bridge  Strengthening;Right;Left;1 set;5 reps   limited ROM on L LE            PT Education - 01/31/20 1140    Education Details  update/consolidation of HEP; discussion on objective progress and remaining impairments; discussion on continuing fitness regimen to tolerance    Person(s) Educated  Patient    Methods  Explanation;Demonstration;Tactile cues;Verbal cues;Handout    Comprehension  Verbalized understanding;Returned demonstration       PT Short Term Goals - 01/31/20 1109      PT SHORT TERM GOAL #1   Title  Patient to be independent with inital HEP.    Time  2    Period  Weeks    Status  Achieved    Target Date  01/03/20        PT Long Term Goals - 01/31/20 1109      PT LONG TERM GOAL #1   Title  Patient to be independent with advanced HEP.    Time  4    Period  Weeks    Status  Achieved      PT LONG TERM GOAL #2   Title  Patient to demonstrate lumbar AROM WFL and  without pain limiting.    Time  4    Period  Weeks    Status  Not Met   unchanged     PT LONG TERM GOAL #3   Title  Patient to demonstrate L hip strength >/=4+/5.    Time  4    Period  Weeks    Status  Partially Met   B hip extension most limiting     PT LONG TERM GOAL #4   Title  Patient to report 75% improvement in pain levels when standing from prolonged sitting.    Time  4    Period  Weeks    Status  Achieved   reports 75% improvement     PT LONG TERM GOAL #5   Title  Patient to report return to fitness program with modifications as needed atleast 2x/week.    Time  4    Period  Weeks    Status  Achieved   using stationary bike 3x/week and walking for fitness daily           Plan - 01/31/20 1141    Clinical Impression Statement  Patient reporting that she is now better able to perform ADLs and participate in fitness regimen than before starting therapy. Notes that her pain still remains but is less intense and has not required use of Meloxicam for pain for the past month. Notes 75% improvement in pain with standing from prolonged sitting. Strength goal has been met for all muscle groups with exception of B hip extension. Patient has met her fitness goal as she reports using a stationary bike 3x/week and walking for fitness daily. Lumbar AROM unchanged for the most part. Thus, worked on uConservation officer, natureHEP with exercises to continue addressing patient's remaining impairments. Patient reported understanding and without complaints at end of session. Patient has demonstrated progress towards goals and is ready for 30 day hold with transition to home  program at this time.    Rehab Potential  Good    PT Treatment/Interventions  ADLs/Self Care Home Management;Cryotherapy;Electrical Stimulation;Moist Heat;Traction;Therapeutic exercise;Therapeutic activities;Functional mobility training;Stair training;Gait training;Ultrasound;Neuromuscular re-education;Cognitive  remediation;Patient/family education;Manual techniques;Taping;Energy conservation;Dry needling;Passive range of motion    PT Next Visit Plan  30 day hold at this time    Consulted and Agree with Plan of Care  Patient       Patient will benefit from skilled therapeutic intervention in order to improve the following deficits and impairments:  Hypomobility, Decreased activity tolerance, Decreased strength, Pain, Increased muscle spasms, Decreased range of motion, Improper body mechanics, Impaired flexibility  Visit Diagnosis: Chronic midline low back pain without sciatica  Other symptoms and signs involving the musculoskeletal system     Problem List Patient Active Problem List   Diagnosis Date Noted  . Chronic midline low back pain without sciatica 08/02/2019  . Abnormal uterine bleeding (AUB) 10/13/2018  . Right hip pain 03/05/2014  . Breast cancer screening 03/02/2014  . Visit for preventive health examination 03/02/2014  . ETD (eustachian tube dysfunction) 03/02/2014  . Right groin pain 02/23/2014  . Right foot pain 06/19/2012     Janene Harvey, PT, DPT 01/31/20 11:49 AM   [facility] [address]  [place], [place], [address] Phone: [phone]   Fax:  [phone]  Name: Lashea Goda MRN: [id number] Date of Birth: April 29, 1971   PHYSICAL THERAPY DISCHARGE SUMMARY  Visits from Start of Care: 5  Current functional level related to goals / functional outcomes: See above clinical impression; patient did not return after last session   Remaining deficits: Limited lumbar AROM and decreased glute strength   Education / Equipment: HEP  Plan: Patient agrees to discharge.  Patient goals were partially met. Patient is being discharged due to being pleased with the current functional level.  ?????     Janene Harvey, PT, DPT 03/19/20 8:37 AM

## 2020-03-21 ENCOUNTER — Ambulatory Visit (INDEPENDENT_AMBULATORY_CARE_PROVIDER_SITE_OTHER): Payer: No Typology Code available for payment source | Admitting: Psychology

## 2020-03-21 DIAGNOSIS — F909 Attention-deficit hyperactivity disorder, unspecified type: Secondary | ICD-10-CM | POA: Diagnosis not present

## 2020-04-20 ENCOUNTER — Other Ambulatory Visit: Payer: Self-pay | Admitting: Family Medicine

## 2020-04-20 DIAGNOSIS — M545 Low back pain, unspecified: Secondary | ICD-10-CM

## 2020-04-20 DIAGNOSIS — G8929 Other chronic pain: Secondary | ICD-10-CM

## 2020-05-21 ENCOUNTER — Ambulatory Visit: Payer: No Typology Code available for payment source | Admitting: Psychology

## 2020-05-23 DIAGNOSIS — F908 Attention-deficit hyperactivity disorder, other type: Secondary | ICD-10-CM

## 2020-06-07 ENCOUNTER — Telehealth: Payer: Self-pay | Admitting: Family Medicine

## 2020-06-07 NOTE — Telephone Encounter (Signed)
Supportive care w fluids. OTC meds for symptoms only. SOB or inability to keep up w fluid losses, go to ER. Ty.

## 2020-06-07 NOTE — Telephone Encounter (Signed)
Caller:Tonya Duran Call back# [phone]  Patient states she tested + for Covid on 06/03/20. Patient is experiencing some chest tightness /back pain she would like some advise.

## 2020-06-07 NOTE — Telephone Encounter (Signed)
Called informed the patient of PCP instructions. She verbalized understanding. 

## 2020-06-21 ENCOUNTER — Encounter: Payer: No Typology Code available for payment source | Admitting: Family Medicine

## 2020-06-25 ENCOUNTER — Other Ambulatory Visit: Payer: Self-pay

## 2020-06-25 ENCOUNTER — Ambulatory Visit: Payer: No Typology Code available for payment source | Admitting: Family Medicine

## 2020-06-25 ENCOUNTER — Encounter: Payer: Self-pay | Admitting: Family Medicine

## 2020-06-25 VITALS — BP 102/62 | HR 75 | Temp 98.3°F | Ht 63.0 in | Wt 165.2 lb

## 2020-06-25 DIAGNOSIS — F909 Attention-deficit hyperactivity disorder, unspecified type: Secondary | ICD-10-CM

## 2020-06-25 DIAGNOSIS — R002 Palpitations: Secondary | ICD-10-CM

## 2020-06-25 HISTORY — DX: Attention-deficit hyperactivity disorder, unspecified type: F90.9

## 2020-06-25 MED ORDER — ATOMOXETINE HCL 40 MG PO CAPS: 40.0000 mg | ORAL_CAPSULE | Freq: Every day | ORAL | 3 refills | Status: DC

## 2020-06-25 NOTE — Patient Instructions (Signed)
Give [place] 2-3 business days to get the results of your labs back.   If you do not hear anything about your referral in the next 1-2 weeks, call our office and ask for an update.  Stay hydrated.   If you start getting shortness of breath, chest pain, lightheadedness, or weakness, seek care.  Let us know if you need anything.

## 2020-06-25 NOTE — Progress Notes (Signed)
Chief Complaint  Patient presents with  . heart palpitations    since having Covid    Tonya Duran is a 49 y.o. female here for palpitations.  Length of issue: 2 weeks after she got  How long does it last: ~30-60 min per episode This is daily, 10+ times per day. Light headedness/passing out? No Chest pain/shortness of breath? Yes Hx of arrhythmia? No Medication changes/illicit substances? No  No caffeine, alcohol, dietary changes.  Dx'd w ADHD by neuropsychology. Has never been on medicine before. Interested in starting something to help with attn.    Past Medical History:  Diagnosis Date  . No known health problems    Past Surgical History:  Procedure Laterality Date  . dilatation and curettage  2001, 2016  . DILITATION & CURRETTAGE/HYSTROSCOPY WITH NOVASURE ABLATION N/A 10/19/2018   Procedure: DILATATION & CURETTAGE/HYSTEROSCOPY WITH NOVASURE ABLATION;  Surgeon: Lavonia Drafts, MD;  Location: [facility];  Service: Gynecology;  Laterality: N/A;  . LEEP  1995  . WISDOM TOOTH EXTRACTION     No Known Allergies Allergies as of 06/25/2020   No Known Allergies     Medication List       Accurate as of June 25, 2020  2:06 PM. If you have any questions, ask your nurse or doctor.        atomoxetine 40 MG capsule Commonly known as: Strattera Take 1 capsule (40 mg total) by mouth daily. Started by: Shelda Pal, DO   meloxicam 15 MG tablet Commonly known as: MOBIC Take 1 tablet (15 mg total) by mouth daily as needed for pain.   nitroGLYCERIN 0.2 mg/hr patch Commonly known as: NITRODUR - Dosed in mg/24 hr Place one patch on low back daily.       BP 102/62 (BP Location: Left Arm, Patient Position: Sitting, Cuff Size: Normal)   Pulse 75   Temp 98.3 F (36.8 C) (Oral)   Ht 5\' 3"  (1.6 m)   Wt 165 lb 4 oz (75 kg)   SpO2 98%   BMI 29.27 kg/m  Gen: Awake, alert, appearing stated age Neck: No thyromegaly or asymmetry Mouth: MMM Heart: RRR, no  bruits, no LE edema Lungs: CTAB, no accessory muscle use Neuro: No cerebellar signs MSK: No muscle group atrophy or asymmetry Psych: Age appropriate judgment and insight, mood/affect WNL  Palpitations - Plan: Ambulatory referral to Cardiology, TSH, Comprehensive metabolic panel, CBC, Magnesium  Attention deficit hyperactivity disorder (ADHD), unspecified ADHD type - Plan: atomoxetine (STRATTERA) 40 MG capsule  1. Refer cards for possible long term monitor. Ck labs. Mind caffeine/ETOH. If worsening sob/cp/lightheadedness, ER.  2. In light of #1, will trial Strattera. Formal eval last mo. Will await records. F/u in 6 weeks to reck this.  The patient voiced understanding and agreement to the plan.  Tonya Duran 2:06 PM 06/25/20

## 2020-06-26 LAB — CBC
HCT: 39.7 % (ref 35.0–45.0)
Hemoglobin: 12.8 g/dL (ref 11.7–15.5)
MCH: 27.1 pg (ref 27.0–33.0)
MCHC: 32.2 g/dL (ref 32.0–36.0)
MCV: 84.1 fL (ref 80.0–100.0)
MPV: 9.9 fL (ref 7.5–12.5)
Platelets: 346 10*3/uL (ref 140–400)
RBC: 4.72 10*6/uL (ref 3.80–5.10)
RDW: 14.1 % (ref 11.0–15.0)
WBC: 5.4 10*3/uL (ref 3.8–10.8)

## 2020-06-26 LAB — COMPREHENSIVE METABOLIC PANEL
AG Ratio: 1.5 (calc) (ref 1.0–2.5)
ALT: 11 U/L (ref 6–29)
AST: 17 U/L (ref 10–35)
Albumin: 4.3 g/dL (ref 3.6–5.1)
Alkaline phosphatase (APISO): 58 U/L (ref 31–125)
BUN: 14 mg/dL (ref 7–25)
CO2: 25 mmol/L (ref 20–32)
Calcium: 9.3 mg/dL (ref 8.6–10.2)
Chloride: 101 mmol/L (ref 98–110)
Creat: 0.64 mg/dL (ref 0.50–1.10)
Globulin: 2.9 g/dL (calc) (ref 1.9–3.7)
Glucose, Bld: 80 mg/dL (ref 65–99)
Potassium: 4.2 mmol/L (ref 3.5–5.3)
Sodium: 137 mmol/L (ref 135–146)
Total Bilirubin: 0.3 mg/dL (ref 0.2–1.2)
Total Protein: 7.2 g/dL (ref 6.1–8.1)

## 2020-06-26 LAB — MAGNESIUM: Magnesium: 2.1 mg/dL (ref 1.5–2.5)

## 2020-06-26 LAB — TSH: TSH: 0.9 mIU/L

## 2020-07-10 ENCOUNTER — Ambulatory Visit (INDEPENDENT_AMBULATORY_CARE_PROVIDER_SITE_OTHER): Payer: No Typology Code available for payment source | Admitting: Cardiology

## 2020-07-10 ENCOUNTER — Ambulatory Visit (INDEPENDENT_AMBULATORY_CARE_PROVIDER_SITE_OTHER): Payer: No Typology Code available for payment source

## 2020-07-10 ENCOUNTER — Encounter: Payer: Self-pay | Admitting: Cardiology

## 2020-07-10 ENCOUNTER — Other Ambulatory Visit: Payer: Self-pay

## 2020-07-10 VITALS — BP 98/72 | HR 82 | Ht 63.0 in | Wt 159.0 lb

## 2020-07-10 DIAGNOSIS — R002 Palpitations: Secondary | ICD-10-CM | POA: Diagnosis not present

## 2020-07-10 DIAGNOSIS — Z8616 Personal history of COVID-19: Secondary | ICD-10-CM

## 2020-07-10 NOTE — Patient Instructions (Signed)
Medication Instructions:  Your physician recommends that you continue on your current medications as directed. Please refer to the Current Medication list given to you today.  *If you need a refill on your cardiac medications before your next appointment, please call your pharmacy*   Lab Work: None If you have labs (blood work) drawn today and your tests are completely normal, you will receive your results only by: Marland Kitchen MyChart Message (if you have MyChart) OR . A paper copy in the mail If you have any lab test that is abnormal or we need to change your treatment, we will call you to review the results.   Testing/Procedures: A zio monitor was ordered today. It will remain on for 7 days. You will then return monitor and event diary in provided box. It takes 1-2 weeks for report to be downloaded and returned to [place]. We will call you with the results. If monitor falls off or has orange flashing light, please call Zio for further instructions.    Your physician has requested that you have an echocardiogram. Echocardiography is a painless test that uses sound waves to create images of your heart. It provides your doctor with information about the size and shape of your heart and how well your heart's chambers and valves are working. This procedure takes approximately one hour. There are no restrictions for this procedure.    Follow-Up: At [facility], you and your health needs are our priority.  As part of our continuing mission to provide you with exceptional heart care, we have created designated Provider Care Teams.  These Care Teams include your primary Cardiologist (physician) and Advanced Practice Providers (APPs -  Physician Assistants and Nurse Practitioners) who all work together to provide you with the care you need, when you need it.  We recommend signing up for the patient portal called "MyChart".  Sign up information is provided on this After Visit Summary.  MyChart is used to connect  with patients for Virtual Visits (Telemedicine).  Patients are able to view lab/test results, encounter notes, upcoming appointments, etc.  Non-urgent messages can be sent to your provider as well.   To learn more about what you can do with MyChart, go to [email or web address].    Your next appointment:   2 month(s)  The format for your next appointment:   In Person  Provider:   Berniece Salines, DO   Other Instructions   Echocardiogram An echocardiogram is a procedure that uses painless sound waves (ultrasound) to produce an image of the heart. Images from an echocardiogram can provide important information about:  Signs of coronary artery disease (CAD).  Aneurysm detection. An aneurysm is a weak or damaged part of an artery wall that bulges out from the normal force of blood pumping through the body.  Heart size and shape. Changes in the size or shape of the heart can be associated with certain conditions, including heart failure, aneurysm, and CAD.  Heart muscle function.  Heart valve function.  Signs of a past heart attack.  Fluid buildup around the heart.  Thickening of the heart muscle.  A tumor or infectious growth around the heart valves. Tell a health care provider about:  Any allergies you have.  All medicines you are taking, including vitamins, herbs, eye drops, creams, and over-the-counter medicines.  Any blood disorders you have.  Any surgeries you have had.  Any medical conditions you have.  Whether you are pregnant or may be pregnant. What are the risks? Generally,  this is a safe procedure. However, problems may occur, including:  Allergic reaction to dye (contrast) that may be used during the procedure. What happens before the procedure? No specific preparation is needed. You may eat and drink normally. What happens during the procedure?   An IV tube may be inserted into one of your veins.  You may receive contrast through this tube. A contrast  is an injection that improves the quality of the pictures from your heart.  A gel will be applied to your chest.  A wand-like tool (transducer) will be moved over your chest. The gel will help to transmit the sound waves from the transducer.  The sound waves will harmlessly bounce off of your heart to allow the heart images to be captured in real-time motion. The images will be recorded on a computer. The procedure may vary among health care providers and hospitals. What happens after the procedure?  You may return to your normal, everyday life, including diet, activities, and medicines, unless your health care provider tells you not to do that. Summary  An echocardiogram is a procedure that uses painless sound waves (ultrasound) to produce an image of the heart.  Images from an echocardiogram can provide important information about the size and shape of your heart, heart muscle function, heart valve function, and fluid buildup around your heart.  You do not need to do anything to prepare before this procedure. You may eat and drink normally.  After the echocardiogram is completed, you may return to your normal, everyday life, unless your health care provider tells you not to do that. This information is not intended to replace advice given to you by your health care provider. Make sure you discuss any questions you have with your health care provider. Document Revised: 01/05/2019 Document Reviewed: 10/17/2016 Elsevier Patient Education  [employer].

## 2020-07-10 NOTE — Progress Notes (Signed)
Cardiology Office Note:    Date:  07/10/2020   ID:  Tonya Duran, DOB 1970-12-26, MRN [id number]  PCP:  Shelda Pal, DO  Cardiologist:  Berniece Salines, DO  Electrophysiologist:  None   Referring MD: Shelda Pal*   Chief Complaint  Patient presents with  . Palpitations    History of Present Illness:    Tonya Duran is a 49 y.o. female with a hx of ADHD and history of recent COVID-19 infection presents today to be evaluated for heart fluttering.  Patient tells me that over the last several months since her Covid infection she has experienced intermittent palpitations which is abrupt fluttering of her heart which last for few minutes and then resolved.  At times it lasts longer than an hour.  She denies any chest pain, shortness of breath, lightheadedness or dizziness.  She is concerned about this as the symptoms are getting worse every day.  No other complaints at this time.  Past Medical History:  Diagnosis Date  . No known health problems     Past Surgical History:  Procedure Laterality Date  . dilatation and curettage  2001, 2016  . DILITATION & CURRETTAGE/HYSTROSCOPY WITH NOVASURE ABLATION N/A 10/19/2018   Procedure: DILATATION & CURETTAGE/HYSTEROSCOPY WITH NOVASURE ABLATION;  Surgeon: Lavonia Drafts, MD;  Location: [facility];  Service: Gynecology;  Laterality: N/A;  . LEEP  1995  . WISDOM TOOTH EXTRACTION      Current Medications: Current Meds  Medication Sig  . meloxicam (MOBIC) 15 MG tablet Take 1 tablet (15 mg total) by mouth daily as needed for pain.     Allergies:   Patient has no known allergies.   Social History   Socioeconomic History  . Marital status: Married    Spouse name: Not on file  . Number of children: Not on file  . Years of education: Not on file  . Highest education level: Not on file  Occupational History  . Not on file  Tobacco Use  . Smoking status: Former Smoker    Packs/day: 0.00    Years: 0.00     Pack years: 0.00    Quit date: 09/28/1993    Years since quitting: 26.8  . Smokeless tobacco: Never Used  Substance and Sexual Activity  . Alcohol use: No  . Drug use: No  . Sexual activity: Yes    Birth control/protection: Condom  Other Topics Concern  . Not on file  Social History Narrative  . Not on file   Social Determinants of Health   Financial Resource Strain:   . Difficulty of Paying Living Expenses: Not on file  Food Insecurity:   . Worried About Charity fundraiser in the Last Year: Not on file  . Ran Out of Food in the Last Year: Not on file  Transportation Needs:   . Lack of Transportation (Medical): Not on file  . Lack of Transportation (Non-Medical): Not on file  Physical Activity:   . Days of Exercise per Week: Not on file  . Minutes of Exercise per Session: Not on file  Stress:   . Feeling of Stress : Not on file  Social Connections:   . Frequency of Communication with Friends and Family: Not on file  . Frequency of Social Gatherings with Friends and Family: Not on file  . Attends Religious Services: Not on file  . Active Member of Clubs or Organizations: Not on file  . Attends Archivist Meetings: Not on file  .  Marital Status: Not on file     Family History: The patient's family history includes Allergies in her son; Alzheimer's disease in her maternal grandmother; Arthritis in her mother; Asthma in her son; Diabetes in her paternal uncle; Healthy in her brother, daughter, sister, and son; Lung cancer in her mother; Lung cancer (age of onset: 52) in her father. There is no history of Sudden death, Hypertension, Hyperlipidemia, or Heart attack.  ROS:   Review of Systems  Constitution: Negative for decreased appetite, fever and weight gain.  HENT: Negative for congestion, ear discharge, hoarse voice and sore throat.   Eyes: Negative for discharge, redness, vision loss in right eye and visual halos.  Cardiovascular: Negative for chest pain, dyspnea  on exertion, leg swelling, orthopnea and palpitations.  Respiratory: Negative for cough, hemoptysis, shortness of breath and snoring.   Endocrine: Negative for heat intolerance and polyphagia.  Hematologic/Lymphatic: Negative for bleeding problem. Does not bruise/bleed easily.  Skin: Negative for flushing, nail changes, rash and suspicious lesions.  Musculoskeletal: Negative for arthritis, joint pain, muscle cramps, myalgias, neck pain and stiffness.  Gastrointestinal: Negative for abdominal pain, bowel incontinence, diarrhea and excessive appetite.  Genitourinary: Negative for decreased libido, genital sores and incomplete emptying.  Neurological: Negative for brief paralysis, focal weakness, headaches and loss of balance.  Psychiatric/Behavioral: Negative for altered mental status, depression and suicidal ideas.  Allergic/Immunologic: Negative for HIV exposure and persistent infections.    EKGs/Labs/Other Studies Reviewed:    The following studies were reviewed today:   EKG:  The ekg ordered today demonstrates sinus rhythm, heart rate 82 bpm.  Recent Labs: 06/25/2020: ALT 11; BUN 14; Creat 0.64; Hemoglobin 12.8; Magnesium 2.1; Platelets 346; Potassium 4.2; Sodium 137; TSH 0.90  Recent Lipid Panel    Component Value Date/Time   CHOL 172 06/21/2019 1611   TRIG 51.0 06/21/2019 1611   HDL 72.90 06/21/2019 1611   CHOLHDL 2 06/21/2019 1611   VLDL 10.2 06/21/2019 1611   LDLCALC 89 06/21/2019 1611    Physical Exam:    VS:  BP 98/72 (BP Location: Right Arm, Patient Position: Sitting, Cuff Size: Normal)   Pulse 82   Ht 5\' 3"  (1.6 m)   Wt 159 lb (72.1 kg)   SpO2 97%   BMI 28.17 kg/m     Wt Readings from Last 3 Encounters:  07/10/20 159 lb (72.1 kg)  06/25/20 165 lb 4 oz (75 kg)  12/06/19 170 lb 8 oz (77.3 kg)     GEN: Well nourished, well developed in no acute distress HEENT: Normal NECK: No JVD; No carotid bruits LYMPHATICS: No lymphadenopathy CARDIAC: S1S2 noted,RRR, no  murmurs, rubs, gallops RESPIRATORY:  Clear to auscultation without rales, wheezing or rhonchi  ABDOMEN: Soft, non-tender, non-distended, +bowel sounds, no guarding. EXTREMITIES: No edema, No cyanosis, no clubbing MUSCULOSKELETAL:  No deformity  SKIN: Warm and dry NEUROLOGIC:  Alert and oriented x 3, non-focal PSYCHIATRIC:  Normal affect, good insight  ASSESSMENT:    1. Palpitations   2. History of COVID-19    PLAN:     I would like to rule out a cardiovascular etiology of this palpitation, therefore at this time I would like to placed a zio patch for   7 days. In additon a transthoracic echocardiogram will be ordered to assess LV/RV function and any structural abnormalities. Once these testing have been performed amd reviewed further reccomendations will be made. For now, I do reccomend that the patient goes to the nearest ED if  symptoms recur. The  patient is in agreement with the above plan. The patient left the office in stable condition.  The patient will follow up in 2 months or sooner if needed.   Medication Adjustments/Labs and Tests Ordered: Current medicines are reviewed at length with the patient today.  Concerns regarding medicines are outlined above.  Orders Placed This Encounter  Procedures  . LONG TERM MONITOR (3-14 DAYS)  . EKG 12-Lead  . ECHOCARDIOGRAM COMPLETE   No orders of the defined types were placed in this encounter.   Patient Instructions  Medication Instructions:  Your physician recommends that you continue on your current medications as directed. Please refer to the Current Medication list given to you today.  *If you need a refill on your cardiac medications before your next appointment, please call your pharmacy*   Lab Work: None If you have labs (blood work) drawn today and your tests are completely normal, you will receive your results only by: Marland Kitchen MyChart Message (if you have MyChart) OR . A paper copy in the mail If you have any lab test that  is abnormal or we need to change your treatment, we will call you to review the results.   Testing/Procedures: A zio monitor was ordered today. It will remain on for 7 days. You will then return monitor and event diary in provided box. It takes 1-2 weeks for report to be downloaded and returned to [place]. We will call you with the results. If monitor falls off or has orange flashing light, please call Zio for further instructions.    Your physician has requested that you have an echocardiogram. Echocardiography is a painless test that uses sound waves to create images of your heart. It provides your doctor with information about the size and shape of your heart and how well your heart's chambers and valves are working. This procedure takes approximately one hour. There are no restrictions for this procedure.    Follow-Up: At [facility], you and your health needs are our priority.  As part of our continuing mission to provide you with exceptional heart care, we have created designated Provider Care Teams.  These Care Teams include your primary Cardiologist (physician) and Advanced Practice Providers (APPs -  Physician Assistants and Nurse Practitioners) who all work together to provide you with the care you need, when you need it.  We recommend signing up for the patient portal called "MyChart".  Sign up information is provided on this After Visit Summary.  MyChart is used to connect with patients for Virtual Visits (Telemedicine).  Patients are able to view lab/test results, encounter notes, upcoming appointments, etc.  Non-urgent messages can be sent to your provider as well.   To learn more about what you can do with MyChart, go to [email or web address].    Your next appointment:   2 month(s)  The format for your next appointment:   In Person  Provider:   Berniece Salines, DO   Other Instructions   Echocardiogram An echocardiogram is a procedure that uses painless sound waves  (ultrasound) to produce an image of the heart. Images from an echocardiogram can provide important information about:  Signs of coronary artery disease (CAD).  Aneurysm detection. An aneurysm is a weak or damaged part of an artery wall that bulges out from the normal force of blood pumping through the body.  Heart size and shape. Changes in the size or shape of the heart can be associated with certain conditions, including heart failure, aneurysm, and CAD.  Heart muscle function.  Heart valve function.  Signs of a past heart attack.  Fluid buildup around the heart.  Thickening of the heart muscle.  A tumor or infectious growth around the heart valves. Tell a health care provider about:  Any allergies you have.  All medicines you are taking, including vitamins, herbs, eye drops, creams, and over-the-counter medicines.  Any blood disorders you have.  Any surgeries you have had.  Any medical conditions you have.  Whether you are pregnant or may be pregnant. What are the risks? Generally, this is a safe procedure. However, problems may occur, including:  Allergic reaction to dye (contrast) that may be used during the procedure. What happens before the procedure? No specific preparation is needed. You may eat and drink normally. What happens during the procedure?   An IV tube may be inserted into one of your veins.  You may receive contrast through this tube. A contrast is an injection that improves the quality of the pictures from your heart.  A gel will be applied to your chest.  A wand-like tool (transducer) will be moved over your chest. The gel will help to transmit the sound waves from the transducer.  The sound waves will harmlessly bounce off of your heart to allow the heart images to be captured in real-time motion. The images will be recorded on a computer. The procedure may vary among health care providers and hospitals. What happens after the  procedure?  You may return to your normal, everyday life, including diet, activities, and medicines, unless your health care provider tells you not to do that. Summary  An echocardiogram is a procedure that uses painless sound waves (ultrasound) to produce an image of the heart.  Images from an echocardiogram can provide important information about the size and shape of your heart, heart muscle function, heart valve function, and fluid buildup around your heart.  You do not need to do anything to prepare before this procedure. You may eat and drink normally.  After the echocardiogram is completed, you may return to your normal, everyday life, unless your health care provider tells you not to do that. This information is not intended to replace advice given to you by your health care provider. Make sure you discuss any questions you have with your health care provider. Document Revised: 01/05/2019 Document Reviewed: 10/17/2016 Elsevier Patient Education  [place].      Adopting a Healthy Lifestyle.  Know what a healthy weight is for you (roughly BMI <25) and aim to maintain this   Aim for 7+ servings of fruits and vegetables daily   65-80+ fluid ounces of water or unsweet tea for healthy kidneys   Limit to max 1 drink of alcohol per day; avoid smoking/tobacco   Limit animal fats in diet for cholesterol and heart health - choose grass fed whenever available   Avoid highly processed foods, and foods high in saturated/trans fats   Aim for low stress - take time to unwind and care for your mental health   Aim for 150 min of moderate intensity exercise weekly for heart health, and weights twice weekly for bone health   Aim for 7-9 hours of sleep daily   When it comes to diets, agreement about the perfect plan isnt easy to find, even among the experts. Experts at the [employer] developed an idea known as the Healthy Eating Plate. Just imagine a plate  divided into logical, healthy portions.  The emphasis is on diet quality:   Load up on vegetables and fruits - one-half of your plate: Aim for color and variety, and remember that potatoes dont count.   Go for whole grains - one-quarter of your plate: Whole wheat, barley, wheat berries, quinoa, oats, brown rice, and foods made with them. If you want pasta, go with whole wheat pasta.   Protein power - one-quarter of your plate: Fish, chicken, beans, and nuts are all healthy, versatile protein sources. Limit red meat.   The diet, however, does go beyond the plate, offering a few other suggestions.   Use healthy plant oils, such as olive, canola, soy, corn, sunflower and peanut. Check the labels, and avoid partially hydrogenated oil, which have unhealthy trans fats.   If youre thirsty, drink water. Coffee and tea are good in moderation, but skip sugary drinks and limit milk and dairy products to one or two daily servings.   The type of carbohydrate in the diet is more important than the amount. Some sources of carbohydrates, such as vegetables, fruits, whole grains, and beans-are healthier than others.   Finally, stay active  Signed, Berniece Salines, DO  07/10/2020 5:38 PM    [facility]

## 2020-07-29 ENCOUNTER — Other Ambulatory Visit: Payer: Self-pay

## 2020-07-29 ENCOUNTER — Telehealth: Payer: Self-pay | Admitting: Cardiology

## 2020-07-29 ENCOUNTER — Ambulatory Visit (HOSPITAL_BASED_OUTPATIENT_CLINIC_OR_DEPARTMENT_OTHER)
Admission: RE | Admit: 2020-07-29 | Discharge: 2020-07-29 | Disposition: A | Discharge status: Home / Self Care | Payer: Self-pay | Source: Ambulatory Visit | Attending: Cardiology | Admitting: Cardiology

## 2020-07-29 DIAGNOSIS — Z8616 Personal history of COVID-19: Secondary | ICD-10-CM | POA: Insufficient documentation

## 2020-07-29 DIAGNOSIS — R002 Palpitations: Secondary | ICD-10-CM | POA: Insufficient documentation

## 2020-07-29 LAB — ECHOCARDIOGRAM COMPLETE
Area-P 1/2: 2.82 cm2
S' Lateral: 2.98 cm

## 2020-07-29 NOTE — Telephone Encounter (Signed)
Transferred call to [facility]

## 2020-08-01 ENCOUNTER — Telehealth: Payer: Self-pay

## 2020-08-01 NOTE — Telephone Encounter (Signed)
-----   Message from Berniece Salines, DO sent at 07/31/2020 10:34 AM EDT ----- Echo normal

## 2020-08-01 NOTE — Telephone Encounter (Signed)
Spoke with patient regarding results and recommendation.  Patient verbalizes understanding and is agreeable to plan of care. Advised patient to call back with any issues or concerns.  

## 2020-08-01 NOTE — Telephone Encounter (Signed)
Left message on patients voicemail to please return our call.   

## 2020-08-06 ENCOUNTER — Ambulatory Visit: Payer: No Typology Code available for payment source | Admitting: Family Medicine

## 2020-08-06 DIAGNOSIS — Z0289 Encounter for other administrative examinations: Secondary | ICD-10-CM

## 2020-09-12 ENCOUNTER — Other Ambulatory Visit: Payer: Self-pay

## 2020-09-12 DIAGNOSIS — Z789 Other specified health status: Secondary | ICD-10-CM | POA: Insufficient documentation

## 2020-09-16 ENCOUNTER — Ambulatory Visit: Payer: No Typology Code available for payment source | Admitting: Cardiology

## 2020-10-02 ENCOUNTER — Other Ambulatory Visit: Payer: No Typology Code available for payment source

## 2021-09-12 ENCOUNTER — Emergency Department
Admission: RE | Admit: 2021-09-12 | Discharge: 2021-09-12 | Disposition: A | Discharge status: Home / Self Care | Payer: Self-pay | Source: Ambulatory Visit

## 2021-09-12 ENCOUNTER — Emergency Department (INDEPENDENT_AMBULATORY_CARE_PROVIDER_SITE_OTHER): Payer: Managed Care, Other (non HMO)

## 2021-09-12 ENCOUNTER — Other Ambulatory Visit: Payer: Self-pay

## 2021-09-12 VITALS — BP 118/81 | HR 81 | Temp 99.2°F | Resp 15

## 2021-09-12 DIAGNOSIS — M7989 Other specified soft tissue disorders: Secondary | ICD-10-CM

## 2021-09-12 DIAGNOSIS — M79671 Pain in right foot: Secondary | ICD-10-CM | POA: Diagnosis not present

## 2021-09-12 NOTE — ED Triage Notes (Signed)
Right foot pain w/ swelling since Tuesday  Ibuprofen OTC - none today  Bruise and lump noted to top of Right foot  No  known injury

## 2021-09-12 NOTE — ED Provider Notes (Signed)
[facility]    CSN: [id number] Arrival date & time: 09/12/21  1656      History   Chief Complaint Chief Complaint  Patient presents with   Foot Pain    HPI Tonya Duran is a 50 y.o. female.   HPI  Past Medical History:  Diagnosis Date   Abnormal uterine bleeding (AUB) 10/13/2018   Attention deficit hyperactivity disorder (ADHD) 06/25/2020   Breast cancer screening 03/02/2014   Chronic midline low back pain without sciatica 08/02/2019   ETD (eustachian tube dysfunction) 03/02/2014   No known health problems    Right foot pain 06/19/2012   Right groin pain 02/23/2014   Right hip pain 03/05/2014   Visit for preventive health examination 03/02/2014    Patient Active Problem List   Diagnosis Date Noted   No known health problems    Attention deficit hyperactivity disorder (ADHD) 06/25/2020   Chronic midline low back pain without sciatica 08/02/2019   Abnormal uterine bleeding (AUB) 10/13/2018   Right hip pain 03/05/2014   Breast cancer screening 03/02/2014   Visit for preventive health examination 03/02/2014   ETD (eustachian tube dysfunction) 03/02/2014   Right groin pain 02/23/2014   Right foot pain 06/19/2012    Past Surgical History:  Procedure Laterality Date   dilatation and curettage  2001, 2016   DILITATION & CURRETTAGE/HYSTROSCOPY WITH NOVASURE ABLATION N/A 10/19/2018   Procedure: DILATATION & CURETTAGE/HYSTEROSCOPY WITH NOVASURE ABLATION;  Surgeon: Lavonia Drafts, MD;  Location: [facility];  Service: Gynecology;  Laterality: N/A;   LEEP  1995   WISDOM TOOTH EXTRACTION      OB History     Gravida  4   Para  3   Term  3   Preterm      AB  1   Living  3      SAB  1   IAB      Ectopic      Multiple      Live Births  3            Home Medications    Prior to Admission medications   Medication Sig Start Date End Date Taking? Authorizing Provider  atomoxetine (STRATTERA) 40 MG capsule Take 1 capsule (40 mg total) by  mouth daily. Patient not taking: Reported on 07/10/2020 06/25/20   Shelda Pal, DO  fluocinonide (LIDEX) 0.05 % external solution Apply topically. Patient not taking: Reported on 09/12/2021 07/01/20   [provider]  ketoconazole (NIZORAL) 2 % shampoo Apply topically. Patient not taking: Reported on 09/12/2021 07/01/20   [provider]  meloxicam (MOBIC) 15 MG tablet Take 1 tablet (15 mg total) by mouth daily as needed for pain. Patient not taking: Reported on 09/12/2021 04/22/20   Shelda Pal, DO  NP THYROID 90 MG tablet Take 90 mg by mouth daily. 08/18/21   [provider]  progesterone (PROMETRIUM) 100 MG capsule Take 100 mg by mouth at bedtime. 06/22/21   [provider]    Family History Family History  Problem Relation Age of Onset   Lung cancer Mother        Living   Arthritis Mother    Lung cancer Father 73       Deceased   Alzheimer's disease Maternal Grandmother    Diabetes Paternal Uncle    Healthy Brother        half-brother   Healthy Sister        half-sister   Asthma Son  Allergies Son    Healthy Daughter        x1   Healthy Son        #2   Sudden death Neg Hx    Hypertension Neg Hx    Hyperlipidemia Neg Hx    Heart attack Neg Hx     Social History Social History   Tobacco Use   Smoking status: Former    Packs/day: 0.00    Years: 0.00    Pack years: 0.00    Types: Cigarettes    Quit date: 09/28/1993    Years since quitting: 27.9   Smokeless tobacco: Never  Vaping Use   Vaping Use: Never used  Substance Use Topics   Alcohol use: No   Drug use: No     Allergies   Patient has no known allergies.   Review of Systems Review of Systems  Musculoskeletal:        Right foot pain for 3 days  All other systems reviewed and are negative.   Physical Exam Triage Vital Signs ED Triage Vitals  Enc Vitals Group     BP 09/12/21 1721 118/81     Pulse Rate 09/12/21 1721 81     Resp 09/12/21  1721 15     Temp 09/12/21 1721 99.2 F (37.3 C)     Temp Source 09/12/21 1721 Oral     SpO2 09/12/21 1721 98 %     Weight --      Height --      Head Circumference --      Peak Flow --      Pain Score 09/12/21 1724 3     Pain Loc --      Pain Edu? --      Excl. in [place]? --    No data found.  Updated Vital Signs BP 118/81 (BP Location: Right Arm)    Pulse 81    Temp 99.2 F (37.3 C) (Oral)    Resp 15    SpO2 98%   Physical Exam Vitals and nursing note reviewed.  Constitutional:      Appearance: Normal appearance. She is normal weight.  HENT:     Head: Normocephalic and atraumatic.     Nose: Nose normal.     Mouth/Throat:     Mouth: Mucous membranes are moist.     Pharynx: Oropharynx is clear.  Eyes:     Extraocular Movements: Extraocular movements intact.     Conjunctiva/sclera: Conjunctivae normal.     Pupils: Pupils are equal, round, and reactive to light.  Cardiovascular:     Rate and Rhythm: Normal rate and regular rhythm.     Pulses: Normal pulses.     Heart sounds: Normal heart sounds.  Pulmonary:     Effort: Pulmonary effort is normal.     Breath sounds: Normal breath sounds.  Musculoskeletal:     Cervical back: Normal range of motion and neck supple.     Comments: Right foot (dorsum): TTP over fourth, fifth proximal metatarsals, and lateral cuboid, mild soft tissue swelling noted, mild pain elicited with plantar flexion.   Neurological:     General: No focal deficit present.     Mental Status: She is alert and oriented to person, place, and time.     UC Treatments / Results  Labs (all labs ordered are listed, but only abnormal results are displayed) Labs Reviewed - No data to display  EKG   Radiology DG Foot Complete Right  Result Date:  09/12/2021 CLINICAL DATA:  Pain on the top of the foot.  Swelling. EXAM: RIGHT FOOT COMPLETE - 3+ VIEW COMPARISON:  None. FINDINGS: There is no evidence of fracture or dislocation. There is no evidence of arthropathy  or other focal bone abnormality. Soft tissues are unremarkable. IMPRESSION: Negative. Electronically Signed   By: Ronney Asters M.D.   On: 09/12/2021 17:53    Procedures Procedures (including critical care time)  Medications Ordered in UC Medications - No data to display  Initial Impression / Assessment and Plan / UC Course  I have reviewed the triage vital signs and the nursing notes.  Pertinent labs & imaging results that were available during my care of the patient were reviewed by me and considered in my medical decision making (see chart for details).     MDM: 1.  Right foot pain-x-ray of right foot reveals above-negative for fracture dislocation no acute osseous process. Patient may take OTC Ibuprofen 600 mg 1-2 times daily, as needed for right foot pain for the next 7 to 10 days.  Advised patient if symptoms  worsen and/or unresolved please follow-up with [facility] Provider (contact information provided above) for further evaluation.  Patient discharged home, hemodynamically stable. Final Clinical Impressions(s) / UC Diagnoses   Final diagnoses:  Foot pain, right     Discharge Instructions      Advised patient of this evening's right foot x-ray results. Advised may take OTC Ibuprofen 600 mg 1-2 times daily, as needed for right foot pain for the next 7 to 10 days.  Advised patient if symptoms  worsen and/or unresolved please follow-up with [facility] Orthopedic Provider (contact information provided above) for further evaluation.     ED Prescriptions   None    PDMP not reviewed this encounter.   Eliezer Lofts, [place] 09/12/21 1826

## 2021-09-12 NOTE — Discharge Instructions (Addendum)
Advised patient of this evening's right foot x-ray results. Advised may take OTC Ibuprofen 600 mg 1-2 times daily, as needed for right foot pain for the next 7 to 10 days.  Advised patient if symptoms  worsen and/or unresolved please follow-up with [facility] Provider (contact information provided above) for further evaluation.

## 2021-09-19 ENCOUNTER — Ambulatory Visit: Payer: Managed Care, Other (non HMO) | Admitting: Family Medicine

## 2021-10-06 ENCOUNTER — Ambulatory Visit: Payer: Managed Care, Other (non HMO) | Admitting: Family Medicine

## 2021-10-06 ENCOUNTER — Ambulatory Visit: Payer: Self-pay

## 2021-10-06 VITALS — BP 110/80 | Ht 63.0 in | Wt 170.0 lb

## 2021-10-06 DIAGNOSIS — M25571 Pain in right ankle and joints of right foot: Secondary | ICD-10-CM

## 2021-10-06 DIAGNOSIS — M67471 Ganglion, right ankle and foot: Secondary | ICD-10-CM | POA: Diagnosis not present

## 2021-10-06 NOTE — Assessment & Plan Note (Signed)
Acutely occurring. -Counseled on home exercise therapy and supportive care. -Ganglion aspiration. -Can repeat aspiration if needed.

## 2021-10-06 NOTE — Progress Notes (Signed)
°  Tonya Duran - 51 y.o. female MRN [id number]  Date of birth: March 05, 1971  SUBJECTIVE:  Including CC & ROS.  No chief complaint on file.   Tonya Duran is a 51 y.o. female that is presenting with right foot pain.  She has a cyst that is occurring over the lateral midfoot.  Independent review of the right foot x-ray from 12/16 shows no acute changes.  Review of the emergency department note from 12/16 shows counseled on over-the-counter medication  Review of Systems See HPI   HISTORY: Past Medical, Surgical, Social, and Family History Reviewed & Updated per EMR.   Pertinent Historical Findings include:  Past Medical History:  Diagnosis Date   Abnormal uterine bleeding (AUB) 10/13/2018   Attention deficit hyperactivity disorder (ADHD) 06/25/2020   Breast cancer screening 03/02/2014   Chronic midline low back pain without sciatica 08/02/2019   ETD (eustachian tube dysfunction) 03/02/2014   No known health problems    Right foot pain 06/19/2012   Right groin pain 02/23/2014   Right hip pain 03/05/2014   Visit for preventive health examination 03/02/2014    Past Surgical History:  Procedure Laterality Date   dilatation and curettage  2001, 2016   [place] N/A 10/19/2018   Procedure: DILATATION & CURETTAGE/HYSTEROSCOPY WITH NOVASURE ABLATION;  Surgeon: Lavonia Drafts, MD;  Location: [facility];  Service: Gynecology;  Laterality: N/A;   LEEP  1995   WISDOM TOOTH EXTRACTION       PHYSICAL EXAM:  VS: BP 110/80    Ht 5\' 3"  (1.6 m)    Wt 170 lb (77.1 kg)    BMI 30.11 kg/m  Physical Exam Gen: NAD, alert, cooperative with exam, well-appearing MSK:  Neurovascularly intact    Limited ultrasound: Right foot:  Ganglion cyst appreciated overlying the cuboid. Normal-appearing peroneal tendons. No ankle effusion  Summary: Ganglion cyst  Ultrasound and interpretation by Clearance Coots, MD   Aspiration/Injection Procedure  Note Tonya Duran [birth year]  Procedure: Aspiration Indications: right foot pain   Procedure Details Consent: Risks of procedure as well as the alternatives and risks of each were explained to the (patient/caregiver).  Consent for procedure obtained. Time Out: Verified patient identification, verified procedure, site/side was marked, verified correct patient position, special equipment/implants available, medications/allergies/relevent history reviewed, required imaging and test results available.  Performed.  The area was cleaned with iodine and alcohol swabs.    The right foot ganglion cyst was injected using 3 cc's of 1% lidocaine with a 25 1 1/2" needle.  An 18-gauge 1-1/2 inch needle was used to achieve aspiration.  Ultrasound was used. Images were obtained in long views showing the injection.    Amount of Fluid Aspirated: minimal amount Character of Fluid: gelatinous Fluid was sent for: n/a  A sterile dressing was applied.  Patient did tolerate procedure well.    ASSESSMENT & PLAN:   Ganglion cyst of right foot Acutely occurring. -Counseled on home exercise therapy and supportive care. -Ganglion aspiration. -Can repeat aspiration if needed.

## 2021-10-06 NOTE — Patient Instructions (Signed)
Nice to meet you Please try ice  Please try compression   Please send me a message in MyChart with any questions or updates.  Please see me back in 4 weeks or as needed if better.   --Dr. Raeford Razor

## 2021-10-15 ENCOUNTER — Telehealth: Payer: Managed Care, Other (non HMO) | Admitting: Physician Assistant

## 2021-10-15 DIAGNOSIS — K219 Gastro-esophageal reflux disease without esophagitis: Secondary | ICD-10-CM | POA: Diagnosis not present

## 2021-10-15 MED ORDER — OMEPRAZOLE 20 MG PO CPDR: 20.0000 mg | DELAYED_RELEASE_CAPSULE | Freq: Every day | ORAL | 0 refills | Status: DC

## 2021-10-15 NOTE — Progress Notes (Signed)
I have spent 5 minutes in review of e-visit questionnaire, review and updating patient chart, medical decision making and response to patient.   Jobanny Mavis Cody Aydrien Froman, PA-C    

## 2021-10-15 NOTE — Progress Notes (Signed)
E-Visit for Heartburn  We are sorry that you are not feeling well.  Here is how we plan to help!  Based on what you shared with me it looks like you most likely have Gastroesophageal Reflux Disease (GERD)  Gastroesophageal reflux disease (GERD) happens when acid from your stomach flows up into the esophagus.  When acid comes in contact with the esophagus, the acid causes sorenss (inflammation) in the esophagus.  Over time, GERD may create small holes (ulcers) in the lining of the esophagus.  I have prescribed Omeprazole 20 mg one by mouth daily until you follow up with a provider.  Your symptoms should improve in the next day or two.  You can use antacids as needed until symptoms resolve.  Call us if your heartburn worsens, you have trouble swallowing, weight loss, spitting up blood or recurrent vomiting.  Home Care: May include lifestyle changes such as weight loss, quitting smoking and alcohol consumption Avoid foods and drinks that make your symptoms worse, such as: Caffeine or alcoholic drinks Chocolate Peppermint or mint flavorings Garlic and onions Spicy foods Citrus fruits, such as oranges, lemons, or limes Tomato-based foods such as sauce, chili, salsa and pizza Fried and fatty foods Avoid lying down for 3 hours prior to your bedtime or prior to taking a nap Eat small, frequent meals instead of a large meals Wear loose-fitting clothing.  Do not wear anything tight around your waist that causes pressure on your stomach. Raise the head of your bed 6 to 8 inches with wood blocks to help you sleep.  Extra pillows will not help.  Seek Help Right Away If: You have pain in your arms, neck, jaw, teeth or back Your pain increases or changes in intensity or duration You develop nausea, vomiting or sweating (diaphoresis) You develop shortness of breath or you faint Your vomit is green, yellow, black or looks like coffee grounds or blood Your stool is red, bloody or black  These  symptoms could be signs of other problems, such as heart disease, gastric bleeding or esophageal bleeding.  Make sure you : Understand these instructions. Will watch your condition. Will get help right away if you are not doing well or get worse.  Your e-visit answers were reviewed by a board certified advanced clinical practitioner to complete your personal care plan.  Depending on the condition, your plan could have included both over the counter or prescription medications.  If there is a problem please reply  once you have received a response from your provider.  Your safety is important to [place].  If you have drug allergies check your prescription carefully.    You can use MyChart to ask questions about todays visit, request a non-urgent call back, or ask for a work or school excuse for 24 hours related to this e-Visit. If it has been greater than 24 hours you will need to follow up with your provider, or enter a new e-Visit to address those concerns.  You will get an e-mail in the next two days asking about your experience.  I hope that your e-visit has been valuable and will speed your recovery. Thank you for using e-visits.

## 2021-11-10 ENCOUNTER — Encounter: Payer: Self-pay | Admitting: Family Medicine

## 2021-11-10 ENCOUNTER — Ambulatory Visit: Payer: Managed Care, Other (non HMO) | Admitting: Family Medicine

## 2021-11-10 VITALS — BP 108/68 | HR 87 | Temp 98.0°F | Ht 63.0 in | Wt 182.1 lb

## 2021-11-10 DIAGNOSIS — K219 Gastro-esophageal reflux disease without esophagitis: Secondary | ICD-10-CM

## 2021-11-10 MED ORDER — OMEPRAZOLE 20 MG PO CPDR: 20.0000 mg | DELAYED_RELEASE_CAPSULE | Freq: Every day | ORAL | 0 refills | Status: DC

## 2021-11-10 NOTE — Progress Notes (Signed)
Chief Complaint  Patient presents with   Follow-up    Subjective: Patient is a 51 y.o. female here for f/u reflux.  Had E visit on 10/15/21 and given omeprazole 20 mg/d for presumed GERD. Since that time, she reports it did help after 3-5 days. Started having s/s's around 1/15. Having burning in chest region, coughing after eating [place] food. No N/V/D, bleeding, fevers, dysphagia. Stress levels are normal. Has gained some wt leading into her symptoms.   Past Medical History:  Diagnosis Date   Abnormal uterine bleeding (AUB) 10/13/2018   Attention deficit hyperactivity disorder (ADHD) 06/25/2020   Breast cancer screening 03/02/2014   Chronic midline low back pain without sciatica 08/02/2019   ETD (eustachian tube dysfunction) 03/02/2014   No known health problems    Right foot pain 06/19/2012   Right groin pain 02/23/2014   Right hip pain 03/05/2014   Visit for preventive health examination 03/02/2014    Objective: BP 108/68    Pulse 87    Temp 98 F (36.7 C) (Oral)    Ht 5\' 3"  (1.6 m)    Wt 182 lb 2 oz (82.6 kg)    SpO2 98%    BMI 32.26 kg/m  General: Awake, appears stated age Heart: RRR, no LE edema Lungs: CTAB, no rales, wheezes or rhonchi. No accessory muscle use Abd: BS+, S, NT, ND Psych: Age appropriate judgment and insight, normal affect and mood  Assessment and Plan: Gastroesophageal reflux disease, unspecified whether esophagitis present - Plan: omeprazole (PRILOSEC) 20 MG capsule  Newer issue. Cont Prilosec for 1 more month and come off. If doing well, stay off. If s/s's return, consider Pepcid or even going back on Prilosec. Reflux precautions discussed.  F/u for CPE at convenience.  The patient voiced understanding and agreement to the plan.  [place], DO 11/10/21  7:52 AM

## 2021-11-10 NOTE — Patient Instructions (Addendum)
The only lifestyle changes that have data behind them are weight loss for the overweight/obese and elevating the head of the bed. Finding out which foods/positions are triggers is important.  After 60 d on the Prilosec, come off and see what happens. You may want to try Pepcid 20 mg twice daily as needed or scheduled. If you need to go back on the Prilosec, let me know.   Let us know if you need anything.

## 2021-12-14 ENCOUNTER — Other Ambulatory Visit: Payer: Self-pay | Admitting: Family Medicine

## 2021-12-14 DIAGNOSIS — K219 Gastro-esophageal reflux disease without esophagitis: Secondary | ICD-10-CM

## 2021-12-17 ENCOUNTER — Other Ambulatory Visit (HOSPITAL_BASED_OUTPATIENT_CLINIC_OR_DEPARTMENT_OTHER): Payer: Self-pay | Admitting: Family Medicine

## 2021-12-17 DIAGNOSIS — Z1231 Encounter for screening mammogram for malignant neoplasm of breast: Secondary | ICD-10-CM

## 2021-12-23 ENCOUNTER — Other Ambulatory Visit: Payer: Self-pay

## 2021-12-23 ENCOUNTER — Encounter (HOSPITAL_BASED_OUTPATIENT_CLINIC_OR_DEPARTMENT_OTHER): Payer: Self-pay

## 2021-12-23 ENCOUNTER — Ambulatory Visit (HOSPITAL_BASED_OUTPATIENT_CLINIC_OR_DEPARTMENT_OTHER)
Admission: RE | Admit: 2021-12-23 | Discharge: 2021-12-23 | Disposition: A | Discharge status: Home / Self Care | Payer: Managed Care, Other (non HMO) | Source: Ambulatory Visit | Attending: Family Medicine | Admitting: Family Medicine

## 2021-12-23 DIAGNOSIS — Z1231 Encounter for screening mammogram for malignant neoplasm of breast: Secondary | ICD-10-CM | POA: Insufficient documentation

## 2022-01-19 ENCOUNTER — Ambulatory Visit (INDEPENDENT_AMBULATORY_CARE_PROVIDER_SITE_OTHER): Payer: Managed Care, Other (non HMO) | Admitting: Family Medicine

## 2022-01-19 ENCOUNTER — Encounter: Payer: Self-pay | Admitting: Family Medicine

## 2022-01-19 ENCOUNTER — Other Ambulatory Visit: Payer: Self-pay | Admitting: Family Medicine

## 2022-01-19 ENCOUNTER — Other Ambulatory Visit (INDEPENDENT_AMBULATORY_CARE_PROVIDER_SITE_OTHER): Payer: Managed Care, Other (non HMO)

## 2022-01-19 VITALS — BP 120/80 | HR 79 | Temp 98.0°F | Ht 63.0 in | Wt 185.1 lb

## 2022-01-19 DIAGNOSIS — Z0000 Encounter for general adult medical examination without abnormal findings: Secondary | ICD-10-CM | POA: Diagnosis not present

## 2022-01-19 DIAGNOSIS — Z1159 Encounter for screening for other viral diseases: Secondary | ICD-10-CM

## 2022-01-19 DIAGNOSIS — Z1211 Encounter for screening for malignant neoplasm of colon: Secondary | ICD-10-CM

## 2022-01-19 DIAGNOSIS — R739 Hyperglycemia, unspecified: Secondary | ICD-10-CM

## 2022-01-19 DIAGNOSIS — E039 Hypothyroidism, unspecified: Secondary | ICD-10-CM | POA: Diagnosis not present

## 2022-01-19 DIAGNOSIS — E669 Obesity, unspecified: Secondary | ICD-10-CM | POA: Diagnosis not present

## 2022-01-19 LAB — LIPID PANEL
Cholesterol: 160 mg/dL (ref 0–200)
HDL: 67.9 mg/dL (ref >39.00)
LDL Cholesterol: 81 mg/dL (ref 0–99)
NonHDL: 91.98
Total CHOL/HDL Ratio: 2
Triglycerides: 54 mg/dL (ref 0.0–149.0)
VLDL: 10.8 mg/dL (ref 0.0–40.0)

## 2022-01-19 LAB — TSH: TSH: 1.48 u[IU]/mL (ref 0.35–5.50)

## 2022-01-19 LAB — COMPREHENSIVE METABOLIC PANEL
ALT: 27 U/L (ref 0–35)
AST: 23 U/L (ref 0–37)
Albumin: 4.2 g/dL (ref 3.5–5.2)
Alkaline Phosphatase: 75 U/L (ref 39–117)
BUN: 11 mg/dL (ref 6–23)
CO2: 27 mEq/L (ref 19–32)
Calcium: 8.8 mg/dL (ref 8.4–10.5)
Chloride: 102 mEq/L (ref 96–112)
Creatinine, Ser: 0.64 mg/dL (ref 0.40–1.20)
GFR: 102.56 mL/min (ref >60.00)
Glucose, Bld: 107 mg/dL — ABNORMAL HIGH (ref 70–99)
Potassium: 4.5 mEq/L (ref 3.5–5.1)
Sodium: 136 mEq/L (ref 135–145)
Total Bilirubin: 0.3 mg/dL (ref 0.2–1.2)
Total Protein: 7.2 g/dL (ref 6.0–8.3)

## 2022-01-19 LAB — CBC
HCT: 41.5 % (ref 36.0–46.0)
Hemoglobin: 13.9 g/dL (ref 12.0–15.0)
MCHC: 33.4 g/dL (ref 30.0–36.0)
MCV: 84.5 fl (ref 78.0–100.0)
Platelets: 353 10*3/uL (ref 150.0–400.0)
RBC: 4.91 Mil/uL (ref 3.87–5.11)
RDW: 13.7 % (ref 11.5–15.5)
WBC: 5.9 10*3/uL (ref 4.0–10.5)

## 2022-01-19 LAB — HEMOGLOBIN A1C: Hgb A1c MFr Bld: 6 % (ref 4.6–6.5)

## 2022-01-19 MED ORDER — SEMAGLUTIDE-WEIGHT MANAGEMENT 2.4 MG/0.75ML ~~LOC~~ SOAJ: 2.4000 mg | SUBCUTANEOUS | 0 refills | Status: DC

## 2022-01-19 MED ORDER — SEMAGLUTIDE-WEIGHT MANAGEMENT 1.7 MG/0.75ML ~~LOC~~ SOAJ: 1.7000 mg | SUBCUTANEOUS | 0 refills | Status: DC

## 2022-01-19 MED ORDER — SEMAGLUTIDE-WEIGHT MANAGEMENT 0.5 MG/0.5ML ~~LOC~~ SOAJ: 0.5000 mg | SUBCUTANEOUS | 0 refills | Status: DC

## 2022-01-19 MED ORDER — SEMAGLUTIDE-WEIGHT MANAGEMENT 1 MG/0.5ML ~~LOC~~ SOAJ: 1.0000 mg | SUBCUTANEOUS | 0 refills | Status: DC

## 2022-01-19 MED ORDER — SEMAGLUTIDE-WEIGHT MANAGEMENT 0.25 MG/0.5ML ~~LOC~~ SOAJ: 0.2500 mg | SUBCUTANEOUS | 0 refills | Status: AC

## 2022-01-19 NOTE — Progress Notes (Signed)
Chief Complaint  ?Patient presents with  ? Annual Exam  ?  ? ?Well Woman ?Tonya Duran is here for a complete physical.   ?Her last physical was >1 year ago.  ?Current diet: in general, diet is fair. ?Current exercise: walking, wt resistance exercise, cycling. ?Weight is stable and she denies fatigue out of ordinary. ?Seatbelt? Yes ?Advanced directive? No ? ?Health Maintenance ?Pap/HPV- No ?Mammogram- Yes ?Colon cancer screening-No ?Shingrix- No ?Tetanus- Yes ?Hep C screening- No ?HIV screening- Yes ? ?Obesity ?Pt has been gaining weight over the last 3 years. She is exercising routinely w walking and lifting wts 3x/week. She cycles as well. She eats around 1200 calories daily. She is hungry all the time. She is interested in a medication if possible.  ? ?Past Medical History:  ?Diagnosis Date  ? Abnormal uterine bleeding (AUB) 10/13/2018  ? Attention deficit hyperactivity disorder (ADHD) 06/25/2020  ? Breast cancer screening 03/02/2014  ? Chronic midline low back pain without sciatica 08/02/2019  ? ETD (eustachian tube dysfunction) 03/02/2014  ? No known health problems   ? Right foot pain 06/19/2012  ? Right groin pain 02/23/2014  ? Right hip pain 03/05/2014  ? Visit for preventive health examination 03/02/2014  ?  ? ?Past Surgical History:  ?Procedure Laterality Date  ? dilatation and curettage  2001, 2016  ? DILITATION & CURRETTAGE/HYSTROSCOPY WITH NOVASURE ABLATION N/A 10/19/2018  ? Procedure: DILATATION & CURETTAGE/HYSTEROSCOPY WITH NOVASURE ABLATION;  Surgeon: Lavonia Drafts, MD;  Location: [facility];  Service: Gynecology;  Laterality: N/A;  ? LEEP  1995  ? WISDOM TOOTH EXTRACTION    ? ? ?Medications  ?Current Outpatient Medications on File Prior to Visit  ?Medication Sig Dispense Refill  ? omeprazole (PRILOSEC) 20 MG capsule TAKE 1 CAPSULE BY MOUTH EVERY DAY 30 capsule 0  ? ?Allergies ?No Known Allergies ? ?Review of Systems: ?Constitutional:  no unexpected weight changes ?Eye:  no recent significant change in  vision ?Ear/Nose/Mouth/Throat:  Ears:  no recent change in hearing ?Nose/Mouth/Throat:  no complaints of nasal congestion, no sore throat ?Cardiovascular: no chest pain ?Respiratory:  no shortness of breath ?Gastrointestinal:  no abdominal pain, no change in bowel habits ?GU:  Female: negative for dysuria or pelvic pain ?Musculoskeletal/Extremities:  no pain of the joints ?Integumentary (Skin/Breast):  no abnormal skin lesions reported ?Neurologic:  no headaches ?Endocrine:  denies fatigue ? ?Exam ?BP 120/80   Pulse 79   Temp 98 ?F (36.7 ?C) (Oral)   Ht '5\' 3"'$  (1.6 m)   Wt 185 lb 2 oz (84 kg)   SpO2 99%   BMI 32.79 kg/m?  ?General:  well developed, well nourished, in no apparent distress ?Skin:  no significant moles, warts, or growths ?Head:  no masses, lesions, or tenderness ?Eyes:  pupils equal and round, sclera anicteric without injection ?Ears:  canals without lesions, TMs shiny without retraction, no obvious effusion, no erythema ?Nose:  nares patent, septum midline, mucosa normal, and no drainage or sinus tenderness ?Throat/Pharynx:  lips and gingiva without lesion; tongue and uvula midline; non-inflamed pharynx; no exudates or postnasal drainage ?Neck: neck supple without adenopathy, thyromegaly, or masses ?Lungs:  clear to auscultation, breath sounds equal bilaterally, no respiratory distress ?Cardio:  regular rate and rhythm, no LE edema ?Abdomen:  abdomen soft, nontender; bowel sounds normal; no masses or organomegaly ?Genital: Defer to GYN ?Musculoskeletal:  symmetrical muscle groups noted without atrophy or deformity ?Extremities:  no clubbing, cyanosis, or edema, no deformities, no skin discoloration ?Neuro:  gait normal; deep  tendon reflexes normal and symmetric ?Psych: well oriented with normal range of affect and appropriate judgment/insight ? ?Assessment and Plan ? ?Well adult exam - Plan: CBC, Comprehensive metabolic panel, Lipid panel ? ?Obesity (BMI 30-39.9) - Plan: Semaglutide-Weight  Management 0.25 MG/0.5ML SOAJ, Semaglutide-Weight Management 0.5 MG/0.5ML SOAJ, Semaglutide-Weight Management 1 MG/0.5ML SOAJ, Semaglutide-Weight Management 1.7 MG/0.75ML SOAJ, Semaglutide-Weight Management 2.4 MG/0.75ML SOAJ ? ?Encounter for hepatitis C screening test for low risk patient - Plan: Hepatitis C antibody ? ?Screen for colon cancer - Plan: Ambulatory referral to Gastroenterology ? ?Hypothyroidism, unspecified type - Plan: TSH  ? ?Well 51 y.o. female. ?Counseled on diet and exercise. ?Other orders as above. ?CCS: Refer GI. ?Advanced directive form provided today.  ?Obesity: Chronic, not controlled. Wegovy rx provided. Will have on highest tolerated dosage. F/u in 6 weeks to reck.  ?The patient voiced understanding and agreement to the plan. ? ?Shelda Pal, DO ?01/19/22 ?8:07 AM ? ?

## 2022-01-19 NOTE — Patient Instructions (Addendum)
Give [place] 2-3 business days to get the results of your labs back.  ? ?Keep the diet clean and stay active. ? ?Aim to do some physical exertion for 150 minutes per week. This is typically divided into 5 days per week, 30 minutes per day. The activity should be enough to get your heart rate up. Anything is better than nothing if you have time constraints. ? ?Please get me a copy of your advanced directive form at your convenience.  ? ?If you do not hear anything about your referral in the next 1-2 weeks, call our office and ask for an update. ? ?Please schedule your pelvic exam at your convenience.  ? ?Let us know if you need anything. ?

## 2022-01-20 LAB — HEPATITIS C ANTIBODY
Hepatitis C Ab: NONREACTIVE
SIGNAL TO CUT-OFF: 0.14 (ref <1.00)

## 2022-01-21 ENCOUNTER — Other Ambulatory Visit: Payer: Self-pay | Admitting: Family Medicine

## 2022-01-21 DIAGNOSIS — K219 Gastro-esophageal reflux disease without esophagitis: Secondary | ICD-10-CM

## 2022-03-03 ENCOUNTER — Encounter: Payer: Self-pay | Admitting: Family Medicine

## 2022-03-03 ENCOUNTER — Ambulatory Visit: Payer: Managed Care, Other (non HMO) | Admitting: Family Medicine

## 2022-03-03 VITALS — BP 109/70 | HR 81 | Temp 98.0°F | Ht 63.0 in | Wt 185.2 lb

## 2022-03-03 DIAGNOSIS — E669 Obesity, unspecified: Secondary | ICD-10-CM | POA: Insufficient documentation

## 2022-03-03 MED ORDER — PHENTERMINE HCL 37.5 MG PO TABS: 37.5000 mg | ORAL_TABLET | Freq: Every day | ORAL | 0 refills | Status: DC

## 2022-03-03 NOTE — Progress Notes (Signed)
Chief Complaint  Patient presents with   Follow-up    6 week Medication did not work    Subjective: Patient is a 51 y.o. female here for f/u obesity tx.  Patient was started on Wegovy 0.25 mg weekly.  While it did reduce her appetite, it made her quite tired.  She fortunately had to stop it.  Her weight has been unchanged.  Diet is overall healthy and she works out routinely.  She has not tried any other medications for weight loss before the [facility].  Past Medical History:  Diagnosis Date   Abnormal uterine bleeding (AUB) 10/13/2018   Attention deficit hyperactivity disorder (ADHD) 06/25/2020   Breast cancer screening 03/02/2014   Chronic midline low back pain without sciatica 08/02/2019   ETD (eustachian tube dysfunction) 03/02/2014   No known health problems    Right foot pain 06/19/2012   Right groin pain 02/23/2014   Right hip pain 03/05/2014   Visit for preventive health examination 03/02/2014    Objective: BP 109/70   Pulse 81   Temp 98 F (36.7 C) (Oral)   Ht '5\' 3"'$  (1.6 m)   Wt 185 lb 4 oz (84 kg)   SpO2 99%   BMI 32.82 kg/m  General: Awake, appears stated age Lungs: No accessory muscle use Psych: Age appropriate judgment and insight, normal affect and mood  Assessment and Plan: Obesity (BMI 30-39.9) - Plan: Amb Ref to Medical Weight Management, phentermine (ADIPEX-P) 37.5 MG tablet  Chronic, uncontrolled.  Refer to medical weight loss team.  Advised her this may take many months to get in due to how busy the practice is.  In the meanwhile, we will trial Adipex 37.5 mg daily.  A 30-day supply was sent and I will see her in 1 month.  I told her we cannot do this longer than 90 days at a time.  Counseled on diet and exercise. The patient voiced understanding and agreement to the plan.  [place], DO 03/03/22  8:20 AM

## 2022-03-03 NOTE — Patient Instructions (Signed)
If you do not hear anything about your referral in the next 1-2 weeks, call our office and ask for an update.  There should be no cost issues with this medication.   Keep the diet clean and stay active.  Let us know if you need anything.

## 2022-03-30 ENCOUNTER — Ambulatory Visit: Payer: Managed Care, Other (non HMO) | Admitting: Family Medicine

## 2022-03-30 ENCOUNTER — Encounter: Payer: Self-pay | Admitting: Family Medicine

## 2022-03-30 VITALS — BP 118/68 | HR 83 | Temp 98.0°F | Ht 63.0 in | Wt 180.4 lb

## 2022-03-30 DIAGNOSIS — E669 Obesity, unspecified: Secondary | ICD-10-CM

## 2022-03-30 MED ORDER — PHENTERMINE HCL 37.5 MG PO TABS: 37.5000 mg | ORAL_TABLET | Freq: Every day | ORAL | 0 refills | Status: DC

## 2022-03-30 MED ORDER — PHENTERMINE HCL 37.5 MG PO CAPS: 37.5000 mg | ORAL_CAPSULE | ORAL | 0 refills | Status: DC

## 2022-03-30 NOTE — Patient Instructions (Signed)
We will do 60 more days of the phentermine.   Keep the diet clean and stay active.  Strong work with your weight loss.  Let us know if you need anything.

## 2022-03-30 NOTE — Progress Notes (Signed)
Chief Complaint  Patient presents with   Follow-up    1 month     Subjective: Patient is a 51 y.o. female here for obesity follow up.  We initially tried to get her on Wegovy but she did not tolerate it. We then sent in 1 month supply of phentermine. I told her that I would not do this longer than 90 d. Since that time, she reports no AE's and compliance. She has lost 6-7 lbs. She is exercising and diet is improving along with reducing portions.   Past Medical History:  Diagnosis Date   Abnormal uterine bleeding (AUB) 10/13/2018   Attention deficit hyperactivity disorder (ADHD) 06/25/2020   Breast cancer screening 03/02/2014   Chronic midline low back pain without sciatica 08/02/2019   ETD (eustachian tube dysfunction) 03/02/2014   No known health problems    Right foot pain 06/19/2012   Right groin pain 02/23/2014   Right hip pain 03/05/2014   Visit for preventive health examination 03/02/2014    Objective: BP 118/68   Pulse 83   Temp 98 F (36.7 C) (Oral)   Ht '5\' 3"'$  (1.6 m)   Wt 180 lb 6 oz (81.8 kg)   SpO2 98%   BMI 31.95 kg/m  General: Awake, appears stated age Heart: RRR, no LE edema Lungs: CTAB, no rales, wheezes or rhonchi. No accessory muscle use Psych: Age appropriate judgment and insight, normal affect and mood  Assessment and Plan: Obesity (BMI 30-39.9) - Plan: phentermine (ADIPEX-P) 37.5 MG tablet, phentermine 37.5 MG capsule  Chronic, stable. Cont 2 months more of phentermine. Hopefully her habits will adjust and she can maintain. F/u in 10ish mo for CPE or prn.  The patient voiced understanding and agreement to the plan.  [place], DO 03/30/22  7:44 AM

## 2022-04-23 ENCOUNTER — Encounter: Payer: Self-pay | Admitting: Family Medicine

## 2022-05-19 ENCOUNTER — Encounter: Payer: Self-pay | Admitting: Internal Medicine

## 2022-05-29 ENCOUNTER — Encounter (INDEPENDENT_AMBULATORY_CARE_PROVIDER_SITE_OTHER): Payer: Managed Care, Other (non HMO) | Admitting: Family Medicine

## 2022-05-29 ENCOUNTER — Encounter (INDEPENDENT_AMBULATORY_CARE_PROVIDER_SITE_OTHER): Payer: Self-pay

## 2022-06-02 ENCOUNTER — Ambulatory Visit (INDEPENDENT_AMBULATORY_CARE_PROVIDER_SITE_OTHER): Payer: Managed Care, Other (non HMO) | Admitting: Family Medicine

## 2022-06-02 ENCOUNTER — Encounter (INDEPENDENT_AMBULATORY_CARE_PROVIDER_SITE_OTHER): Payer: Self-pay | Admitting: Family Medicine

## 2022-06-02 VITALS — BP 134/82 | HR 80 | Temp 98.2°F | Ht 64.0 in | Wt 185.0 lb

## 2022-06-02 DIAGNOSIS — R7303 Prediabetes: Secondary | ICD-10-CM

## 2022-06-02 DIAGNOSIS — Z0289 Encounter for other administrative examinations: Secondary | ICD-10-CM

## 2022-06-02 DIAGNOSIS — R632 Polyphagia: Secondary | ICD-10-CM

## 2022-06-02 DIAGNOSIS — E669 Obesity, unspecified: Secondary | ICD-10-CM

## 2022-06-02 DIAGNOSIS — Z6831 Body mass index (BMI) 31.0-31.9, adult: Secondary | ICD-10-CM

## 2022-06-02 DIAGNOSIS — R030 Elevated blood-pressure reading, without diagnosis of hypertension: Secondary | ICD-10-CM

## 2022-06-02 DIAGNOSIS — E66811 Obesity, class 1: Secondary | ICD-10-CM

## 2022-06-02 NOTE — Progress Notes (Signed)
  Office: [phone]  /  Fax: [phone]  Program Consultation  Deeanne Deininger was seen in clinic today to discuss her diagnosis of obesity. We did a program consultation to discuss her options.  We discussed obesity as a disease and the importance of a proper evaluation of all the factors contributing to the disease.  We discussed the importance of long term lifestyle changes which include nutrition, exercise and behavioral modifications as well as the importance of customizing this to her specific health and social needs.  Current health conditions we discussed and we expect to improve include prediabetes, pre-hypertension and polyphagia.  We discussed the goals of this program is to improve her overall health and not simply achieve a specific BMI.  Frequent visit are very important to patient success. I plan to see her every 2 weeks for the first 3 months and then evaluate the visit frequency after that time. I explained obesity is a life-long chronic disease and long term treatments would be required. Medications to help her follow his eating plan may be offered as appropriate but are not required. All medication decisions will be made together after benefits and side effects are discussed in depth.  The clinic rules were reviewed including the late policy, cancellation policy and the no show fees. The $99 program fee was discussed as well.  Denene Alamillo appears to be in the action stage of change and  is approved to start the program   30 minutes was spent today on this visit including the above counseling, pre-visit chart review, and post-visit documentation.

## 2022-06-05 ENCOUNTER — Ambulatory Visit (AMBULATORY_SURGERY_CENTER): Payer: Self-pay | Admitting: *Deleted

## 2022-06-05 VITALS — Ht 63.0 in | Wt 187.0 lb

## 2022-06-05 DIAGNOSIS — Z1211 Encounter for screening for malignant neoplasm of colon: Secondary | ICD-10-CM

## 2022-06-05 MED ORDER — NA SULFATE-K SULFATE-MG SULF 17.5-3.13-1.6 GM/177ML PO SOLN: 1.0000 | ORAL | 0 refills | Status: DC

## 2022-06-05 NOTE — Progress Notes (Signed)
Patient is here in-person for PV. Patient denies any allergies to eggs or soy. Patient denies any problems with anesthesia/sedation. Patient is not on any oxygen at home. Patient is not taking any diet/weight loss medications or blood thinners. Went over procedure prep instructions with the patient. Patient is aware of our care-partner policy. Patient notified to use Singlecare card given to pt for prescription. Patient denies constipation.

## 2022-06-10 ENCOUNTER — Encounter (INDEPENDENT_AMBULATORY_CARE_PROVIDER_SITE_OTHER): Payer: Self-pay | Admitting: Family Medicine

## 2022-06-10 ENCOUNTER — Ambulatory Visit (INDEPENDENT_AMBULATORY_CARE_PROVIDER_SITE_OTHER): Payer: Managed Care, Other (non HMO) | Admitting: Family Medicine

## 2022-06-10 VITALS — BP 108/76 | HR 73 | Temp 98.0°F | Ht 64.0 in | Wt 183.0 lb

## 2022-06-10 DIAGNOSIS — E669 Obesity, unspecified: Secondary | ICD-10-CM | POA: Diagnosis not present

## 2022-06-10 DIAGNOSIS — E538 Deficiency of other specified B group vitamins: Secondary | ICD-10-CM

## 2022-06-10 DIAGNOSIS — R7303 Prediabetes: Secondary | ICD-10-CM

## 2022-06-10 DIAGNOSIS — R0602 Shortness of breath: Secondary | ICD-10-CM | POA: Diagnosis not present

## 2022-06-10 DIAGNOSIS — E559 Vitamin D deficiency, unspecified: Secondary | ICD-10-CM | POA: Diagnosis not present

## 2022-06-10 DIAGNOSIS — Z1331 Encounter for screening for depression: Secondary | ICD-10-CM | POA: Diagnosis not present

## 2022-06-10 DIAGNOSIS — R5383 Other fatigue: Secondary | ICD-10-CM | POA: Insufficient documentation

## 2022-06-10 DIAGNOSIS — Z6831 Body mass index (BMI) 31.0-31.9, adult: Secondary | ICD-10-CM

## 2022-06-11 LAB — CBC WITH DIFFERENTIAL/PLATELET
Basophils Absolute: 0 10*3/uL (ref 0.0–0.2)
Basos: 1 %
EOS (ABSOLUTE): 0.1 10*3/uL (ref 0.0–0.4)
Eos: 2 %
Hematocrit: 43.6 % (ref 34.0–46.6)
Hemoglobin: 14.3 g/dL (ref 11.1–15.9)
Immature Grans (Abs): 0 10*3/uL (ref 0.0–0.1)
Immature Granulocytes: 0 %
Lymphocytes Absolute: 1.8 10*3/uL (ref 0.7–3.1)
Lymphs: 30 %
MCH: 28.7 pg (ref 26.6–33.0)
MCHC: 32.8 g/dL (ref 31.5–35.7)
MCV: 88 fL (ref 79–97)
Monocytes Absolute: 0.3 10*3/uL (ref 0.1–0.9)
Monocytes: 6 %
Neutrophils Absolute: 3.7 10*3/uL (ref 1.4–7.0)
Neutrophils: 61 %
Platelets: 336 10*3/uL (ref 150–450)
RBC: 4.98 x10E6/uL (ref 3.77–5.28)
RDW: 12.9 % (ref 11.7–15.4)
WBC: 6 10*3/uL (ref 3.4–10.8)

## 2022-06-11 LAB — CMP14+EGFR
ALT: 27 IU/L (ref 0–32)
AST: 24 IU/L (ref 0–40)
Albumin/Globulin Ratio: 1.8 (ref 1.2–2.2)
Albumin: 4.6 g/dL (ref 3.8–4.9)
Alkaline Phosphatase: 99 IU/L (ref 44–121)
BUN/Creatinine Ratio: 11 (ref 9–23)
BUN: 8 mg/dL (ref 6–24)
Bilirubin Total: 0.3 mg/dL (ref 0.0–1.2)
CO2: 23 mmol/L (ref 20–29)
Calcium: 9.2 mg/dL (ref 8.7–10.2)
Chloride: 98 mmol/L (ref 96–106)
Creatinine, Ser: 0.72 mg/dL (ref 0.57–1.00)
Globulin, Total: 2.6 g/dL (ref 1.5–4.5)
Glucose: 86 mg/dL (ref 70–99)
Potassium: 4.6 mmol/L (ref 3.5–5.2)
Sodium: 136 mmol/L (ref 134–144)
Total Protein: 7.2 g/dL (ref 6.0–8.5)
eGFR: 101 mL/min/{1.73_m2} (ref >59)

## 2022-06-11 LAB — TSH: TSH: 1.6 u[IU]/mL (ref 0.450–4.500)

## 2022-06-11 LAB — LIPID PANEL WITH LDL/HDL RATIO
Cholesterol, Total: 186 mg/dL (ref 100–199)
HDL: 73 mg/dL (ref >39)
LDL Chol Calc (NIH): 101 mg/dL — ABNORMAL HIGH (ref 0–99)
LDL/HDL Ratio: 1.4 ratio (ref 0.0–3.2)
Triglycerides: 63 mg/dL (ref 0–149)
VLDL Cholesterol Cal: 12 mg/dL (ref 5–40)

## 2022-06-11 LAB — T3: T3, Total: 122 ng/dL (ref 71–180)

## 2022-06-11 LAB — VITAMIN B12: Vitamin B-12: 491 pg/mL (ref 232–1245)

## 2022-06-11 LAB — VITAMIN D 25 HYDROXY (VIT D DEFICIENCY, FRACTURES): Vit D, 25-Hydroxy: 28 ng/mL — ABNORMAL LOW (ref 30.0–100.0)

## 2022-06-11 LAB — HEMOGLOBIN A1C
Est. average glucose Bld gHb Est-mCnc: 114 mg/dL
Hgb A1c MFr Bld: 5.6 % (ref 4.8–5.6)

## 2022-06-11 LAB — T4, FREE: Free T4: 1.06 ng/dL (ref 0.82–1.77)

## 2022-06-11 LAB — INSULIN, RANDOM: INSULIN: 2.2 u[IU]/mL — ABNORMAL LOW (ref 2.6–24.9)

## 2022-06-14 NOTE — Progress Notes (Unsigned)
Chief Complaint:   OBESITY Tonya Duran (MR# [id number]) is a 51 y.o. female who presents for evaluation and treatment of obesity and related comorbidities. Current BMI is Body mass index is 31.41 kg/m. Shekinah has been struggling with her weight for many years and has been unsuccessful in either losing weight, maintaining weight loss, or reaching her healthy weight goal.  Caya is currently in the action stage of change and ready to dedicate time achieving and maintaining a healthier weight. Kamauri is interested in becoming our patient and working on intensive lifestyle modifications including (but not limited to) diet and exercise for weight loss.  Audrinna's habits were reviewed today and are as follows: Her family eats meals together, her desired weight loss is 43 lbs, she has been heavy most of her life, she started gaining weight 4 years ago, her heaviest weight ever was 185 pounds, she has significant food cravings issues, she is frequently drinking liquids with calories, she frequently makes poor food choices, she has problems with excessive hunger, she frequently eats larger portions than normal, and she struggles with emotional eating.  Depression Screen Bernece's Food and Mood (modified PHQ-9) score was 18.     06/10/2022    9:49 AM  Depression screen PHQ 2/9  Decreased Interest 3  Down, Depressed, Hopeless 3  PHQ - 2 Score 6  Altered sleeping 0  Tired, decreased energy 3  Change in appetite 3  Feeling bad or failure about yourself  2  Trouble concentrating 3  Moving slowly or fidgety/restless 1  Suicidal thoughts 0  PHQ-9 Score 18  Difficult doing work/chores Somewhat difficult   Subjective:   1. Other fatigue Raynah admits to daytime somnolence and admits to waking up still tired. Patient has a history of symptoms of daytime fatigue and morning fatigue. Nicolena generally gets 7 hours of sleep per night, and states that she has generally restful  sleep. Snoring is present. Apneic episodes are not present. Epworth Sleepiness Score is 12.   2. SOBOE (shortness of breath on exertion) Carmell [place] notes increasing shortness of breath with exercising and seems to be worsening over time with weight gain. She notes getting out of breath sooner with activity than she used to. This has not gotten worse recently. Lajeana denies shortness of breath at rest or orthopnea.  3. Prediabetes Saman has a history of elevated glucose and polyphagia.  4. Vitamin D deficiency Juliett is not on vitamin D, and she has a history of vitamin D deficiency.  5. B12 deficiency Trenee is not on B12 supplementation, and she has a history of B12 deficiency.  Assessment/Plan:   1. Other fatigue Elara does feel that her weight is causing her energy to be lower than it should be. Fatigue may be related to obesity, depression or many other causes. Labs will be ordered, and in the meanwhile, Tamerra will focus on self care including making healthy food choices, increasing physical activity and focusing on stress reduction.  - EKG 12-Lead - CBC with Differential/Platelet - CMP14+EGFR - Lipid Panel With LDL/HDL Ratio - T3 - T4, free - TSH  2. SOBOE (shortness of breath on exertion) Catherina does feel that she gets out of breath more easily that she used to when she exercises. Bo's shortness of breath appears to be obesity related and exercise induced. She has agreed to work on weight loss and gradually increase exercise to treat her exercise induced shortness of breath. Will continue to monitor closely.  - EKG  12-Lead  3. Prediabetes We will check labs today. Meri will start her Category 2 plan, exercise, and decreasing simple carbohydrates to help decrease the risk of diabetes.   - Hemoglobin A1c - Insulin, random  4. Vitamin D deficiency We will check labs today. Eldonna will follow-up for routine testing of Vitamin D, at least 2-3 times  per year to avoid over-replacement.  - VITAMIN D 25 Hydroxy (Vit-D Deficiency, Fractures)  5. B12 deficiency The diagnosis was reviewed with the patient. We will check labs today, and we will follow-up at [facility] next visit. Orders and follow up as documented in patient record.  - Vitamin B12  6. Depression screening Hadlee had a positive depression screening. Depression is commonly associated with obesity and often results in emotional eating behaviors. We will monitor this closely and work on CBT to help improve the non-hunger eating patterns. Referral to Psychology may be required if no improvement is seen as she continues in our clinic.  7. Obesity with current BMI of 31.4 Ommie is currently in the action stage of change and her goal is to continue with weight loss efforts. I recommend Valia begin the structured treatment plan as follows:  She has agreed to the Category 2 Plan.  Exercise goals: No exercise has been prescribed for now, while we concentrate on nutritional changes.   Behavioral modification strategies: increasing lean protein intake and no skipping meals.  She was informed of the importance of frequent follow-up visits to maximize her success with intensive lifestyle modifications for her multiple health conditions. She was informed we would discuss her lab results at her next visit unless there is a critical issue that needs to be addressed sooner. Harriette agreed to keep her next visit at the agreed upon time to discuss these results.  Objective:   Blood pressure 108/76, pulse 73, temperature 98 F (36.7 C), height '5\' 4"'  (1.626 m), weight 183 lb (83 kg), SpO2 97 %. Body mass index is 31.41 kg/m.  EKG: Normal sinus rhythm, rate 74 BPM.  Indirect Calorimeter completed today shows a VO2 of 210 and a REE of 2022.  Her calculated basal metabolic rate is 7425 thus her basal metabolic rate is better than expected.  General: Cooperative, alert, well developed,  in no acute distress. HEENT: Conjunctivae and lids unremarkable. Cardiovascular: Regular rhythm.  Lungs: Normal work of breathing. Neurologic: No focal deficits.   Lab Results  Component Value Date   CREATININE 0.72 06/10/2022   BUN 8 06/10/2022   NA 136 06/10/2022   K 4.6 06/10/2022   CL 98 06/10/2022   CO2 23 06/10/2022   Lab Results  Component Value Date   ALT 27 06/10/2022   AST 24 06/10/2022   ALKPHOS 99 06/10/2022   BILITOT 0.3 06/10/2022   Lab Results  Component Value Date   HGBA1C 5.6 06/10/2022   HGBA1C 6.0 01/19/2022   Lab Results  Component Value Date   INSULIN 2.2 (L) 06/10/2022   Lab Results  Component Value Date   TSH 1.600 06/10/2022   Lab Results  Component Value Date   CHOL 186 06/10/2022   HDL 73 06/10/2022   LDLCALC 101 (H) 06/10/2022   TRIG 63 06/10/2022   CHOLHDL 2 01/19/2022   Lab Results  Component Value Date   WBC 6.0 06/10/2022   HGB 14.3 06/10/2022   HCT 43.6 06/10/2022   MCV 88 06/10/2022   PLT 336 06/10/2022   No results found for: "IRON", "TIBC", "FERRITIN"  Attestation Statements:  Reviewed by clinician on day of visit: allergies, medications, problem list, medical history, surgical history, family history, social history, and previous encounter notes.  Time spent on visit including pre-visit chart review and post-visit charting and care was 44 minutes.   I, Trixie Dredge, am acting as transcriptionist for Dennard Nip, MD.  I have reviewed the above documentation for accuracy and completeness, and I agree with the above. - Dennard Nip, MD

## 2022-06-15 ENCOUNTER — Encounter: Payer: Self-pay | Admitting: Internal Medicine

## 2022-06-17 ENCOUNTER — Ambulatory Visit (INDEPENDENT_AMBULATORY_CARE_PROVIDER_SITE_OTHER): Payer: Managed Care, Other (non HMO) | Admitting: Family Medicine

## 2022-06-24 ENCOUNTER — Encounter (INDEPENDENT_AMBULATORY_CARE_PROVIDER_SITE_OTHER): Payer: Self-pay | Admitting: Family Medicine

## 2022-06-24 ENCOUNTER — Ambulatory Visit (INDEPENDENT_AMBULATORY_CARE_PROVIDER_SITE_OTHER): Payer: Managed Care, Other (non HMO) | Admitting: Family Medicine

## 2022-06-24 VITALS — BP 123/80 | HR 74 | Temp 98.3°F | Ht 64.0 in | Wt 181.0 lb

## 2022-06-24 DIAGNOSIS — R7303 Prediabetes: Secondary | ICD-10-CM | POA: Diagnosis not present

## 2022-06-24 DIAGNOSIS — Z6831 Body mass index (BMI) 31.0-31.9, adult: Secondary | ICD-10-CM

## 2022-06-24 DIAGNOSIS — E559 Vitamin D deficiency, unspecified: Secondary | ICD-10-CM

## 2022-06-24 DIAGNOSIS — E669 Obesity, unspecified: Secondary | ICD-10-CM

## 2022-06-24 MED ORDER — VITAMIN D (ERGOCALCIFEROL) 1.25 MG (50000 UNIT) PO CAPS: 50000.0000 [IU] | ORAL_CAPSULE | ORAL | 0 refills | Status: DC

## 2022-06-29 NOTE — Progress Notes (Signed)
Chief Complaint:   OBESITY Tonya Duran is here to discuss her progress with her obesity treatment plan along with follow-up of her obesity related diagnoses. Tonya Duran is on the Category 2 Plan and states she is following her eating plan approximately 98% of the time. Tonya Duran states she is doing 0 minutes 0 times per week.  Today's visit was #: 2 Starting weight: 183 lbs Starting date: 06/10/2022 Today's weight: 181 lbs Today's date: 06/24/2022 Total lbs lost to date: 2 Total lbs lost since last in-office visit: 2  Interim History: Tonya Duran has done well with weight loss. She notes some hunger but overall she is satisfied. She noticed more hunger in the afternoons after lunch.   Subjective:   1. Vitamin D deficiency Tonya Duran's Vitamin D level is 28.0. we discussed the needs for Vitamin D supplementation. I discussed labs with the patient today.   2. Prediabetes Tonya Duran's previous A1c was 6.0, and most recent A1c is 5.6. I discussed labs with the patient today.   Assessment/Plan:   1. Vitamin D deficiency Tonya Duran agreed to start prescription Vitamin D 50,000 IU every week, with no refills. We will recheck labs in 3 months. She will follow-up for routine testing of Vitamin D, at least 2-3 times per year to avoid over-replacement.  - Vitamin D, Ergocalciferol, (DRISDOL) 1.25 MG (50000 UNIT) CAPS capsule; Take 1 capsule (50,000 Units total) by mouth every 7 (seven) days.  Dispense: 4 capsule; Refill: 0  2. Prediabetes Tonya Duran will continue with her diet, and we will follow-up on her labs in 3 months.   3. Obesity, Current BMI 31.2 Tonya Duran is currently in the action stage of change. As such, her goal is to continue with weight loss efforts. She has agreed to the Category 2 Plan.   She will continue her diet and added lunch options were provided.   Behavioral modification strategies: increasing lean protein intake, decreasing simple carbohydrates, and better snacking  choices.  Tonya Duran has agreed to follow-up with our clinic in 2 weeks. She was informed of the importance of frequent follow-up visits to maximize her success with intensive lifestyle modifications for her multiple health conditions.   Objective:   Blood pressure 123/80, pulse 74, temperature 98.3 F (36.8 C), height '5\' 4"'$  (1.626 m), weight 181 lb (82.1 kg), SpO2 99 %. Body mass index is 31.07 kg/m.  General: Cooperative, alert, well developed, in no acute distress. HEENT: Conjunctivae and lids unremarkable. Cardiovascular: Regular rhythm.  Lungs: Normal work of breathing. Neurologic: No focal deficits.   Lab Results  Component Value Date   CREATININE 0.72 06/10/2022   BUN 8 06/10/2022   NA 136 06/10/2022   K 4.6 06/10/2022   CL 98 06/10/2022   CO2 23 06/10/2022   Lab Results  Component Value Date   ALT 27 06/10/2022   AST 24 06/10/2022   ALKPHOS 99 06/10/2022   BILITOT 0.3 06/10/2022   Lab Results  Component Value Date   HGBA1C 5.6 06/10/2022   HGBA1C 6.0 01/19/2022   Lab Results  Component Value Date   INSULIN 2.2 (L) 06/10/2022   Lab Results  Component Value Date   TSH 1.600 06/10/2022   Lab Results  Component Value Date   CHOL 186 06/10/2022   HDL 73 06/10/2022   LDLCALC 101 (H) 06/10/2022   TRIG 63 06/10/2022   CHOLHDL 2 01/19/2022   Lab Results  Component Value Date   VD25OH 28.0 (L) 06/10/2022   Lab Results  Component Value Date  WBC 6.0 06/10/2022   HGB 14.3 06/10/2022   HCT 43.6 06/10/2022   MCV 88 06/10/2022   PLT 336 06/10/2022   No results found for: "IRON", "TIBC", "FERRITIN"  Attestation Statements:   Reviewed by clinician on day of visit: allergies, medications, problem list, medical history, surgical history, family history, social history, and previous encounter notes.  Time spent on visit including pre-visit chart review and post-visit care and charting was 48 minutes.   I, Trixie Dredge, am acting as transcriptionist for  Dennard Nip, MD.  I have reviewed the above documentation for accuracy and completeness, and I agree with the above. -  Dennard Nip, MD

## 2022-06-30 ENCOUNTER — Ambulatory Visit: Payer: Managed Care, Other (non HMO) | Admitting: Internal Medicine

## 2022-06-30 ENCOUNTER — Encounter: Payer: Self-pay | Admitting: Internal Medicine

## 2022-06-30 VITALS — BP 127/77 | HR 60 | Temp 97.1°F | Resp 16 | Ht 63.0 in | Wt 187.0 lb

## 2022-06-30 DIAGNOSIS — Z1211 Encounter for screening for malignant neoplasm of colon: Secondary | ICD-10-CM | POA: Diagnosis present

## 2022-06-30 DIAGNOSIS — D120 Benign neoplasm of cecum: Secondary | ICD-10-CM | POA: Diagnosis not present

## 2022-06-30 DIAGNOSIS — D124 Benign neoplasm of descending colon: Secondary | ICD-10-CM

## 2022-06-30 MED ORDER — SODIUM CHLORIDE 0.9 % IV SOLN: 500.0000 mL | Freq: Once | INTRAVENOUS | Status: DC

## 2022-06-30 NOTE — Progress Notes (Signed)
Called to room to assist during endoscopic procedure.  Patient ID and intended procedure confirmed with present staff. Received instructions for my participation in the procedure from the performing physician.  

## 2022-06-30 NOTE — Op Note (Signed)
[facility] Patient Name: Tonya Duran Procedure Date: 06/30/2022 7:33 AM MRN: [id number] Endoscopist: Jerene Bears , MD Age: 51 Referring MD:  Date of Birth: [birth year] Gender: Female Account #: [id number] Procedure:                Colonoscopy Indications:              Screening for colorectal malignant neoplasm, This                            is the patient's first colonoscopy Medicines:                Monitored Anesthesia Care Procedure:                Pre-Anesthesia Assessment:                           - Prior to the procedure, a History and Physical                            was performed, and patient medications and                            allergies were reviewed. The patient's tolerance of                            previous anesthesia was also reviewed. The risks                            and benefits of the procedure and the sedation                            options and risks were discussed with the patient.                            All questions were answered, and informed consent                            was obtained. Prior Anticoagulants: The patient has                            taken no previous anticoagulant or antiplatelet                            agents. ASA Grade Assessment: II - A patient with                            mild systemic disease. After reviewing the risks                            and benefits, the patient was deemed in                            satisfactory condition to undergo the procedure.  After obtaining informed consent, the colonoscope                            was passed under direct vision. Throughout the                            procedure, the patient's blood pressure, pulse, and                            oxygen saturations were monitored continuously. The                            PCF-HQ190L Colonoscope was introduced through the                            anus and advanced to the  cecum, identified by                            appendiceal orifice and ileocecal valve. The                            colonoscopy was performed without difficulty. The                            patient tolerated the procedure well. The quality                            of the bowel preparation was good. The ileocecal                            valve, appendiceal orifice, and rectum were                            photographed. Scope In: 8:16:35 AM Scope Out: 8:36:08 AM Scope Withdrawal Time: 0 hours 14 minutes 57 seconds  Total Procedure Duration: 0 hours 19 minutes 33 seconds  Findings:                 The digital rectal exam was normal.                           A 5 mm polyp was found in the cecum. The polyp was                            sessile. The polyp was removed with a cold snare.                            Resection and retrieval were complete.                           Three sessile polyps were found in the descending                            colon. The polyps were 4 to 5 mm in size.  These                            polyps were removed with a cold snare. Resection                            and retrieval were complete.                           Multiple small and large-mouthed diverticula were                            found in the sigmoid colon.                           The retroflexed view of the distal rectum and anal                            verge was normal and showed no anal or rectal                            abnormalities. Complications:            No immediate complications. Estimated Blood Loss:     Estimated blood loss was minimal. Impression:               - One 5 mm polyp in the cecum, removed with a cold                            snare. Resected and retrieved.                           - Three 4 to 5 mm polyps in the descending colon,                            removed with a cold snare. Resected and retrieved.                           - Moderate  diverticulosis in the sigmoid colon.                           - The distal rectum and anal verge are normal on                            retroflexion view. Recommendation:           - Patient has a contact number available for                            emergencies. The signs and symptoms of potential                            delayed complications were discussed with the                            patient.  Return to normal activities tomorrow.                            Written discharge instructions were provided to the                            patient.                           - Resume previous diet.                           - Continue present medications.                           - Await pathology results.                           - Repeat colonoscopy is recommended. The                            colonoscopy date will be determined after pathology                            results from today's exam become available for                            review. Jerene Bears, MD 06/30/2022 8:39:30 AM This report has been signed electronically.

## 2022-06-30 NOTE — Patient Instructions (Addendum)
YOU HAD AN ENDOSCOPIC PROCEDURE TODAY AT [facility]:   Refer to the procedure report that was given to you for any specific questions about what was found during the examination.  If the procedure report does not answer your questions, please call your gastroenterologist to clarify.  If you requested that your care partner not be given the details of your procedure findings, then the procedure report has been included in a sealed envelope for you to review at your convenience later.  YOU SHOULD EXPECT: Some feelings of bloating in the abdomen. Passage of more gas than usual.  Walking can help get rid of the air that was put into your GI tract during the procedure and reduce the bloating. If you had a lower endoscopy (such as a colonoscopy or flexible sigmoidoscopy) you may notice spotting of blood in your stool or on the toilet paper. If you underwent a bowel prep for your procedure, you may not have a normal bowel movement for a few days.  Please Note:  You might notice some irritation and congestion in your nose or some drainage.  This is from the oxygen used during your procedure.  There is no need for concern and it should clear up in a day or so.  SYMPTOMS TO REPORT IMMEDIATELY:  Following lower endoscopy (colonoscopy or flexible sigmoidoscopy):  Excessive amounts of blood in the stool  Significant tenderness or worsening of abdominal pains  Swelling of the abdomen that is new, acute  Fever of 100F or higher   For urgent or emergent issues, a gastroenterologist can be reached at any hour by calling [phone]. Do not use MyChart messaging for urgent concerns.    DIET:  We do recommend a small meal at first, but then you may proceed to your regular diet.  Drink plenty of fluids but you should avoid alcoholic beverages for 24 hours.  ACTIVITY:  You should plan to take it easy for the rest of today and you should NOT DRIVE or use heavy machinery until tomorrow (because  of the sedation medicines used during the test).    Handouts given to patient regarding polyps and diverticulosis.  FOLLOW UP:  - Patient has a contact number available for emergencies. The signs and symptoms of potential delayed complications were discussed with the patient. Return to normal activities tomorrow. Written discharge instructions were provided to the patient. - Resume previous diet. - Continue present medications. - Await pathology results. - Repeat colonoscopy is recommended. The colonoscopy date will be determined after pathology results from today's exam become available for review. - Patient has a contact number available for emergencies. The signs and symptoms of potential delayed complications were discussed with the patient. Return to normal activities tomorrow. Written discharge instructions were provided to the patient. - Resume previous diet. - Continue present medications. - Await pathology results. - Our staff will call the number listed on your records the next business day following your procedure.  We will call around 7:15- 8:00 am to check on you and address any questions or concerns that you may have regarding the information given to you following your procedure. If we do not reach you, we will leave a message.     If any biopsies were taken you will be contacted by phone or by letter within the next 1-3 weeks.  Please call us at [phone] if you have not heard about the biopsies in 3 weeks.    SIGNATURES/CONFIDENTIALITY: You and/or your care  partner have signed paperwork which will be entered into your electronic medical record.  These signatures attest to the fact that that the information above on your After Visit Summary has been reviewed and is understood.  Full responsibility of the confidentiality of this discharge information lies with you and/or your care-partner.  

## 2022-06-30 NOTE — Progress Notes (Signed)
GASTROENTEROLOGY PROCEDURE H&P NOTE   Primary Care Physician: Shelda Pal, DO    Reason for Procedure:  Colon cancer screening  Plan:    colonoscopy  Patient is appropriate for endoscopic procedure(s) in the ambulatory ([place]) setting.  The nature of the procedure, as well as the risks, benefits, and alternatives were carefully and thoroughly reviewed with the patient. Ample time for discussion and questions allowed. The patient understood, was satisfied, and agreed to proceed.     HPI: Tonya Duran is a 51 y.o. female who presents for screening colonoscopy.  Medical history as below.  Tolerated the prep.  No recent chest pain or shortness of breath.  No abdominal pain today.  Past Medical History:  Diagnosis Date   Abnormal uterine bleeding (AUB) 10/13/2018   Attention deficit hyperactivity disorder (ADHD) 06/25/2020   B12 deficiency    Back pain    Breast cancer screening 03/02/2014   Chronic midline low back pain without sciatica 08/02/2019   ETD (eustachian tube dysfunction) 03/02/2014   Hypertension    No known health problems    Palpitations    Pre-diabetes    Right foot pain 06/19/2012   Right groin pain 02/23/2014   Right hip pain 03/05/2014   Tired    Visit for preventive health examination 03/02/2014   Vitamin D deficiency     Past Surgical History:  Procedure Laterality Date   dilatation and curettage  2001, 2016   [place] N/A 10/19/2018   Procedure: DILATATION & CURETTAGE/HYSTEROSCOPY WITH NOVASURE ABLATION;  Surgeon: Lavonia Drafts, MD;  Location: [facility];  Service: Gynecology;  Laterality: N/A;   LEEP  1995   WISDOM TOOTH EXTRACTION      Prior to Admission medications   Medication Sig Start Date End Date Taking? Authorizing Provider  Multiple Vitamin (MULTIVITAMIN) tablet Take 1 tablet by mouth daily.   Yes [provider]  Vitamin D, Ergocalciferol, (DRISDOL) 1.25  MG (50000 UNIT) CAPS capsule Take 1 capsule (50,000 Units total) by mouth every 7 (seven) days. Patient not taking: Reported on 06/30/2022 06/24/22   Dennard Nip D, MD    Current Outpatient Medications  Medication Sig Dispense Refill   Multiple Vitamin (MULTIVITAMIN) tablet Take 1 tablet by mouth daily.     Vitamin D, Ergocalciferol, (DRISDOL) 1.25 MG (50000 UNIT) CAPS capsule Take 1 capsule (50,000 Units total) by mouth every 7 (seven) days. (Patient not taking: Reported on 06/30/2022) 4 capsule 0   Current Facility-Administered Medications  Medication Dose Route Frequency Provider Last Rate Last Admin   0.9 %  sodium chloride infusion  500 mL Intravenous Once Ranada Vigorito, Lajuan Lines, MD        Allergies as of 06/30/2022   (No Known Allergies)    Family History  Problem Relation Age of Onset   Lung cancer Mother        Living   Arthritis Mother    Obesity Mother    Lung cancer Father 6       Deceased   Alcohol abuse Father    Healthy Sister        half-sister   Healthy Brother        half-brother   Alzheimer's disease Maternal Grandmother    Healthy Daughter        x1   Asthma Son    Allergies Son    Healthy Son        #2   Diabetes Paternal Uncle    Sudden death  Neg Hx    Hypertension Neg Hx    Hyperlipidemia Neg Hx    Heart attack Neg Hx    Colon cancer Neg Hx    Colon polyps Neg Hx    Esophageal cancer Neg Hx    Stomach cancer Neg Hx    Rectal cancer Neg Hx     Social History   Socioeconomic History   Marital status: Married    Spouse name: Not on file   Number of children: Not on file   Years of education: Not on file   Highest education level: Not on file  Occupational History   Not on file  Tobacco Use   Smoking status: Former    Packs/day: 0.00    Years: 0.00    Total pack years: 0.00    Types: Cigarettes    Quit date: 09/28/1993    Years since quitting: 28.7   Smokeless tobacco: Never  Vaping Use   Vaping Use: Never used  Substance and Sexual  Activity   Alcohol use: Yes    Comment: occ wine   Drug use: No   Sexual activity: Yes    Birth control/protection: Condom  Other Topics Concern   Not on file  Social History Narrative   Not on file   Social Determinants of Health   Financial Resource Strain: Not on file  Food Insecurity: Not on file  Transportation Needs: Not on file  Physical Activity: Not on file  Stress: Not on file  Social Connections: Not on file  Intimate Partner Violence: Not on file    Physical Exam: Vital signs in last 24 hours: '@BP'$  118/72   Pulse 78   Temp (!) 97.1 F (36.2 C) (Temporal)   Ht '5\' 3"'$  (1.6 m)   Wt 187 lb (84.8 kg)   SpO2 97%   BMI 33.13 kg/m  GEN: NAD EYE: Sclerae anicteric ENT: MMM CV: Non-tachycardic Pulm: CTA b/l GI: Soft, NT/ND NEURO:  Alert & Oriented x 3   Zenovia Jarred, MD [facility]  06/30/2022 8:08 AM

## 2022-06-30 NOTE — Progress Notes (Signed)
Pt's states no medical or surgical changes since previsit or office visit. 

## 2022-06-30 NOTE — Progress Notes (Signed)
Pt resting comfortably. VSS. Airway intact. SBAR complete to RN. All questions answered.   

## 2022-07-01 ENCOUNTER — Telehealth: Payer: Self-pay

## 2022-07-01 NOTE — Telephone Encounter (Signed)
  Follow up Call-     06/30/2022    7:37 AM  Call back number  Post procedure Call Back phone  # [phone]  Permission to leave phone message Yes     Patient questions:  Do you have a fever, pain , or abdominal swelling? No. Pain Score  0 *  Have you tolerated food without any problems? Yes.    Have you been able to return to your normal activities? Yes.    Do you have any questions about your discharge instructions: Diet   No. Medications  No. Follow up visit  No.  Do you have questions or concerns about your Care? No.  Actions: * If pain score is 4 or above: No action needed, pain <4.

## 2022-07-03 ENCOUNTER — Encounter: Payer: Self-pay | Admitting: Internal Medicine

## 2022-07-06 ENCOUNTER — Ambulatory Visit (INDEPENDENT_AMBULATORY_CARE_PROVIDER_SITE_OTHER): Payer: Managed Care, Other (non HMO) | Admitting: Family Medicine

## 2022-07-07 ENCOUNTER — Ambulatory Visit (INDEPENDENT_AMBULATORY_CARE_PROVIDER_SITE_OTHER): Payer: Managed Care, Other (non HMO) | Admitting: Family Medicine

## 2022-07-07 ENCOUNTER — Encounter (INDEPENDENT_AMBULATORY_CARE_PROVIDER_SITE_OTHER): Payer: Self-pay | Admitting: Family Medicine

## 2022-07-07 VITALS — BP 128/84 | HR 72 | Temp 98.3°F | Ht 64.0 in | Wt 178.0 lb

## 2022-07-07 DIAGNOSIS — Z6831 Body mass index (BMI) 31.0-31.9, adult: Secondary | ICD-10-CM

## 2022-07-07 DIAGNOSIS — E669 Obesity, unspecified: Secondary | ICD-10-CM | POA: Diagnosis not present

## 2022-07-07 DIAGNOSIS — R7303 Prediabetes: Secondary | ICD-10-CM | POA: Diagnosis not present

## 2022-07-07 DIAGNOSIS — E559 Vitamin D deficiency, unspecified: Secondary | ICD-10-CM

## 2022-07-07 DIAGNOSIS — E538 Deficiency of other specified B group vitamins: Secondary | ICD-10-CM | POA: Diagnosis not present

## 2022-07-07 DIAGNOSIS — Z6830 Body mass index (BMI) 30.0-30.9, adult: Secondary | ICD-10-CM

## 2022-07-21 ENCOUNTER — Ambulatory Visit (INDEPENDENT_AMBULATORY_CARE_PROVIDER_SITE_OTHER): Payer: Managed Care, Other (non HMO) | Admitting: Family Medicine

## 2022-07-21 ENCOUNTER — Encounter (INDEPENDENT_AMBULATORY_CARE_PROVIDER_SITE_OTHER): Payer: Self-pay | Admitting: Family Medicine

## 2022-07-21 VITALS — BP 118/81 | HR 73 | Temp 98.3°F | Ht 64.0 in | Wt 178.0 lb

## 2022-07-21 DIAGNOSIS — E559 Vitamin D deficiency, unspecified: Secondary | ICD-10-CM | POA: Diagnosis not present

## 2022-07-21 DIAGNOSIS — F39 Unspecified mood [affective] disorder: Secondary | ICD-10-CM

## 2022-07-21 DIAGNOSIS — E669 Obesity, unspecified: Secondary | ICD-10-CM

## 2022-07-21 DIAGNOSIS — R7303 Prediabetes: Secondary | ICD-10-CM | POA: Diagnosis not present

## 2022-07-21 DIAGNOSIS — Z6830 Body mass index (BMI) 30.0-30.9, adult: Secondary | ICD-10-CM

## 2022-07-21 DIAGNOSIS — E66811 Obesity, class 1: Secondary | ICD-10-CM

## 2022-07-21 MED ORDER — BUPROPION HCL ER (SR) 150 MG PO TB12: 150.0000 mg | ORAL_TABLET | Freq: Every day | ORAL | 0 refills | Status: DC

## 2022-07-21 MED ORDER — VITAMIN D (ERGOCALCIFEROL) 1.25 MG (50000 UNIT) PO CAPS: 50000.0000 [IU] | ORAL_CAPSULE | ORAL | 0 refills | Status: DC

## 2022-07-22 NOTE — Progress Notes (Signed)
error 

## 2022-07-28 NOTE — Progress Notes (Signed)
Chief Complaint:   OBESITY Tonya Duran is here to discuss her progress with her obesity treatment plan along with follow-up of her obesity related diagnoses. Tonya Duran is on the Category 2 Plan and states she is following her eating plan approximately 50% of the time. Tonya Duran states she is walking for 30 minutes 3 times per week.  Today's visit was #: 4 Starting weight: 183 lbs Starting date: 06/10/2022 Today's weight: 178 lbs Today's date: 07/21/2022 Total lbs lost to date: 5 Total lbs lost since last in-office visit: 0  Interim History: Tonya Duran did well until her vacation was some celebration eating at [employer].  She has some difficulty getting back on track and she is feeling frustrated with meal planning.  She reports cravings for carbohydrates.  Subjective:   1. Prediabetes Tonya Duran's last A1c was 5.6 and insulin 2.2.  She is not on medications, and she is getting back on track with her eating plan.  2. Vitamin D deficiency Tonya Duran's last vitamin D level was 28.0.  She is taking vitamin D 50,000 IU once weekly.  3. Mood disorder, emotional eating behavior Tonya Duran is not on medications.  We discussed the role of neurotransmitters and discussed that Wellbutrin may help with cravings.  Assessment/Plan:   1. Prediabetes Tonya Duran will continue with her healthy eating plan and exercise.  2. Vitamin D deficiency Tonya Duran will continue prescription vitamin D 50,000 IU once weekly, and we will refill for 1 month.  We will recheck her vitamin D level 2-3 times per year to avoid oversupplementation.  - Vitamin D, Ergocalciferol, (DRISDOL) 1.25 MG (50000 UNIT) CAPS capsule; Take 1 capsule (50,000 Units total) by mouth every 7 (seven) days.  Dispense: 4 capsule; Refill: 0  3. Mood disorder, emotional eating behavior Tonya Duran agreed to start Wellbutrin SR 150 mg once daily with no refills.   - buPROPion (WELLBUTRIN SR) 150 MG 12 hr tablet; Take 1 tablet (150 mg total) by mouth  daily.  Dispense: 30 tablet; Refill: 0  4. Obesity, Current BMI 30.6 Tonya Duran is currently in the action stage of change. As such, her goal is to continue with weight loss efforts. She has agreed to the Category 2 Plan.   Exercise goals: As is.   Behavioral modification strategies: increasing lean protein intake, decreasing simple carbohydrates, meal planning and cooking strategies, and keeping healthy foods in the home.  Tonya Duran has agreed to follow-up with our clinic in 2 to 3 weeks. She was informed of the importance of frequent follow-up visits to maximize her success with intensive lifestyle modifications for her multiple health conditions.   Objective:   Blood pressure 118/81, pulse 73, temperature 98.3 F (36.8 C), height '5\' 4"'$  (1.626 m), weight 178 lb (80.7 kg), SpO2 98 %. Body mass index is 30.55 kg/m.  General: Cooperative, alert, well developed, in no acute distress. HEENT: Conjunctivae and lids unremarkable. Cardiovascular: Regular rhythm.  Lungs: Normal work of breathing. Neurologic: No focal deficits.   Lab Results  Component Value Date   CREATININE 0.72 06/10/2022   BUN 8 06/10/2022   NA 136 06/10/2022   K 4.6 06/10/2022   CL 98 06/10/2022   CO2 23 06/10/2022   Lab Results  Component Value Date   ALT 27 06/10/2022   AST 24 06/10/2022   ALKPHOS 99 06/10/2022   BILITOT 0.3 06/10/2022   Lab Results  Component Value Date   HGBA1C 5.6 06/10/2022   HGBA1C 6.0 01/19/2022   Lab Results  Component Value Date  INSULIN 2.2 (L) 06/10/2022   Lab Results  Component Value Date   TSH 1.600 06/10/2022   Lab Results  Component Value Date   CHOL 186 06/10/2022   HDL 73 06/10/2022   LDLCALC 101 (H) 06/10/2022   TRIG 63 06/10/2022   CHOLHDL 2 01/19/2022   Lab Results  Component Value Date   VD25OH 28.0 (L) 06/10/2022   Lab Results  Component Value Date   WBC 6.0 06/10/2022   HGB 14.3 06/10/2022   HCT 43.6 06/10/2022   MCV 88 06/10/2022   PLT 336  06/10/2022   No results found for: "IRON", "TIBC", "FERRITIN"  Attestation Statements:   Reviewed by clinician on day of visit: allergies, medications, problem list, medical history, surgical history, family history, social history, and previous encounter notes.   I, Trixie Dredge, am acting as transcriptionist for Dennard Nip, MD.  I have reviewed the above documentation for accuracy and completeness, and I agree with the above. -  Dennard Nip, MD

## 2022-08-01 NOTE — Progress Notes (Unsigned)
Chief Complaint:   OBESITY Tonya Duran is here to discuss her progress with her obesity treatment plan along with follow-up of her obesity related diagnoses. Tonya Duran is on the Category 2 Plan and states she is following her eating plan approximately 90% of the time. [name] states she is not currently exercising.  Today's visit was #: 3 Starting weight: 183 lbs Starting date: 06/10/2022 Today's weight: 178 lbs Today's date: 07/07/2022 Total lbs lost to date: 5 Total lbs lost since last in-office visit: 3  Interim History: This is only Tonya Duran's 4th OV and her first OV with me. She has previously been seen by [name]. Pt lost over 3 pounds in at mass since her last OV and is doing very well. Her visceral fat rating went from 7 last OV to 8 this OV.   Subjective:   1. Prediabetes Tonya Duran's previous A1c was 6.0 about 6 months ago and 5.6 on 06/10/2022. She says it is difficult to get all foods in at once and she occasionally has hunger between lunch and dinner.  2. Vitamin D deficiency Pt's Vit D level was 28.0 on 06/10/2022 and not at goal. Tonya Duran is tolerating medication(s) well without side effects.  Medication compliance is good as patient endorses taking it as prescribed.  The patient denies additional concerns regarding this condition.      3. B12 deficiency Pt with history of B12 deficiency and recent labs showed B12 level of 491 and within normal limits. Her energy is okay and slowly improving.  Assessment/Plan:  No orders of the defined types were placed in this encounter.   There are no discontinued medications.   No orders of the defined types were placed in this encounter.    1. Prediabetes [name] will continue to work on weight loss, exercise, and decreasing simple carbohydrates to help decrease the risk of diabetes.  Break up lunch into 2 wraps with 2-3 oz protein on each, which would help with some afternoon hunger. Continue weight loss via  prudent nutritional plan.  2. Vitamin D deficiency Low Vitamin D level contributes to fatigue and are associated with obesity, breast, and colon cancer. She agrees to continue to take prescription Vitamin D '@50'$ ,000 IU every week and will follow-up for routine testing of Vitamin D, at least 2-3 times per year to avoid over-replacement. Pt denies need for refill at this time.  3. B12 deficiency The diagnosis was reviewed with the patient. Counseling provided today, see below. We will continue to monitor. Orders and follow up as documented in patient record. Pt is to continue prudent nutritional plan rich in B12. Continue to monitor periodically.  Counseling The body needs vitamin B12: to make red blood cells; to make DNA; and to help the nerves work properly so they can carry messages from the brain to the body.  The main causes of vitamin B12 deficiency include dietary deficiency, digestive diseases, pernicious anemia, and having a surgery in which part of the stomach or small intestine is removed.  Certain medicines can make it harder for the body to absorb vitamin B12. These medicines include: heartburn medications; some antibiotics; some medications used to treat diabetes, gout, and high cholesterol.  In some cases, there are no symptoms of this condition. If the condition leads to anemia or nerve damage, various symptoms can occur, such as weakness or fatigue, shortness of breath, and numbness or tingling in your hands and feet.   Treatment:  May include taking vitamin B12 supplements.  Avoid alcohol.  Eat lots of healthy foods that contain vitamin B12: Beef, pork, chicken, [place], and organ meats, such as liver.  Seafood: This includes clams, rainbow trout, salmon, tuna, and haddock. Eggs.  Cereal and dairy products that are fortified: This means that vitamin B12 has been added to the food.   4. Obesity, Current BMI 30.7 Tonya Duran is currently in the action stage of change. As such, her  goal is to continue with weight loss efforts. She has agreed to the Category 2 Plan.   Pt will break up lunch into 2 mini meals and use wraps. No skipping foods or meals.  Exercise goals:  As is  Behavioral modification strategies: meal planning and cooking strategies.  Tonya Duran has agreed to follow-up with our clinic in 2 weeks with [name]. She was informed of the importance of frequent follow-up visits to maximize her success with intensive lifestyle modifications for her multiple health conditions.   Objective:   Blood pressure 128/84, pulse 72, temperature 98.3 F (36.8 C), height '5\' 4"'$  (1.626 m), weight 178 lb (80.7 kg), SpO2 99 %. Body mass index is 30.55 kg/m.  General: Cooperative, alert, well developed, in no acute distress. HEENT: Conjunctivae and lids unremarkable. Cardiovascular: Regular rhythm.  Lungs: Normal work of breathing. Neurologic: No focal deficits.   Lab Results  Component Value Date   CREATININE 0.72 06/10/2022   BUN 8 06/10/2022   NA 136 06/10/2022   K 4.6 06/10/2022   CL 98 06/10/2022   CO2 23 06/10/2022   Lab Results  Component Value Date   ALT 27 06/10/2022   AST 24 06/10/2022   ALKPHOS 99 06/10/2022   BILITOT 0.3 06/10/2022   Lab Results  Component Value Date   HGBA1C 5.6 06/10/2022   HGBA1C 6.0 01/19/2022   Lab Results  Component Value Date   INSULIN 2.2 (L) 06/10/2022   Lab Results  Component Value Date   TSH 1.600 06/10/2022   Lab Results  Component Value Date   CHOL 186 06/10/2022   HDL 73 06/10/2022   LDLCALC 101 (H) 06/10/2022   TRIG 63 06/10/2022   CHOLHDL 2 01/19/2022   Lab Results  Component Value Date   VD25OH 28.0 (L) 06/10/2022   Lab Results  Component Value Date   WBC 6.0 06/10/2022   HGB 14.3 06/10/2022   HCT 43.6 06/10/2022   MCV 88 06/10/2022   PLT 336 06/10/2022   Attestation Statements:   Reviewed by clinician on day of visit: allergies, medications, problem list, medical history, surgical  history, family history, social history, and previous encounter notes.  I, Kathlene November, BS, CMA, am acting as transcriptionist for [employer], DO.   I have reviewed the above documentation for accuracy and completeness, and I agree with the above. Marjory Sneddon, D.O.  The [place] was signed into law in 2016 which includes the topic of electronic health records.  This provides immediate access to information in MyChart.  This includes consultation notes, operative notes, office notes, lab results and pathology reports.  If you have any questions about what you read please let us know at your next visit so we can discuss your concerns and take corrective action if need be.  We are right here with you.

## 2022-08-04 ENCOUNTER — Ambulatory Visit (INDEPENDENT_AMBULATORY_CARE_PROVIDER_SITE_OTHER): Payer: Managed Care, Other (non HMO) | Admitting: Family Medicine

## 2022-08-04 ENCOUNTER — Encounter (INDEPENDENT_AMBULATORY_CARE_PROVIDER_SITE_OTHER): Payer: Self-pay | Admitting: Family Medicine

## 2022-08-04 VITALS — BP 145/90 | HR 89 | Temp 98.2°F | Ht 64.0 in | Wt 175.0 lb

## 2022-08-04 DIAGNOSIS — E669 Obesity, unspecified: Secondary | ICD-10-CM

## 2022-08-04 DIAGNOSIS — F39 Unspecified mood [affective] disorder: Secondary | ICD-10-CM

## 2022-08-04 DIAGNOSIS — E559 Vitamin D deficiency, unspecified: Secondary | ICD-10-CM

## 2022-08-04 DIAGNOSIS — Z6830 Body mass index (BMI) 30.0-30.9, adult: Secondary | ICD-10-CM

## 2022-08-04 MED ORDER — VITAMIN D (ERGOCALCIFEROL) 1.25 MG (50000 UNIT) PO CAPS: 50000.0000 [IU] | ORAL_CAPSULE | ORAL | 0 refills | Status: DC

## 2022-08-04 MED ORDER — BUPROPION HCL ER (SR) 150 MG PO TB12: 150.0000 mg | ORAL_TABLET | Freq: Two times a day (BID) | ORAL | 0 refills | Status: DC

## 2022-08-18 ENCOUNTER — Ambulatory Visit (INDEPENDENT_AMBULATORY_CARE_PROVIDER_SITE_OTHER): Payer: Managed Care, Other (non HMO) | Admitting: Family Medicine

## 2022-08-18 NOTE — Progress Notes (Signed)
Chief Complaint:   OBESITY Tonya Duran is here to discuss her progress with her obesity treatment plan along with follow-up of her obesity related diagnoses. Tonya Duran is on the Category 2 Plan and states she is following her eating plan approximately 75% of the time. Tonya Duran states she is not currently exercising.  Today's visit was #: 5 Starting weight: 183 lbs Starting date: 06/10/2022 Today's weight: 175 lbs Today's date: 08/04/2022 Total lbs lost to date: 8 Total lbs lost since last in-office visit: 3  Interim History: Tonya Duran is here for a follow up office visit.  We reviewed her meal plan and all questions were answered.  Patient's food recall appears to be accurate and consistent with what is on plan when she is following it.   When eating on plan, her hunger and cravings are well controlled.    Subjective:   1. Vitamin D deficiency Tonya Duran's compliance is good. She takes Vitamin D weekly. Her last Vitamin D level was 28.0 on 06/10/2022.  2. Mood disorder, emotional eating behavior Tonya Duran was started on Wellbutrin at her last office visit. She feels it is working well. She notes less cravings and possibly less hunger. She is sleeping better possibly (a deeper sleep). She desires an increase in dose.   Assessment/Plan:  No orders of the defined types were placed in this encounter.   Medications Discontinued During This Encounter  Medication Reason   buPROPion (WELLBUTRIN SR) 150 MG 12 hr tablet Reorder   Vitamin D, Ergocalciferol, (DRISDOL) 1.25 MG (50000 UNIT) CAPS capsule Reorder     Meds ordered this encounter  Medications   buPROPion (WELLBUTRIN SR) 150 MG 12 hr tablet    Sig: Take 1 tablet (150 mg total) by mouth 2 (two) times daily.    Dispense:  60 tablet    Refill:  0   Vitamin D, Ergocalciferol, (DRISDOL) 1.25 MG (50000 UNIT) CAPS capsule    Sig: Take 1 capsule (50,000 Units total) by mouth every 7 (seven) days.    Dispense:  4 capsule     Refill:  0     1. Vitamin D deficiency We will refill prescription Vitamin D 50,000 IU every week for 1 month. Tonya Duran will follow-up for routine testing of Vitamin D, at least 2-3 times per year to avoid over-replacement.  - Vitamin D, Ergocalciferol, (DRISDOL) 1.25 MG (50000 UNIT) CAPS capsule; Take 1 capsule (50,000 Units total) by mouth every 7 (seven) days.  Dispense: 4 capsule; Refill: 0  2. Mood disorder, emotional eating behavior Tonya Duran agreed to increase Wellbutrin SR  to 150 mg BID with a 1 month refill. She will start exercise, and continue her prudent nutritional plan and weight loss.   - buPROPion (WELLBUTRIN SR) 150 MG 12 hr tablet; Take 1 tablet (150 mg total) by mouth 2 (two) times daily.  Dispense: 60 tablet; Refill: 0  3. Obesity, Current BMI 30.0 Tonya Duran is currently in the action stage of change. As such, her goal is to continue with weight loss efforts. She has agreed to the Category 2 Plan.   Patient desires to start walking more at work 6,000-7,000 steps per day. Her goal is over 10,000 step per day.   Exercise goals: All adults should avoid inactivity. Some physical activity is better than none, and adults who participate in any amount of physical activity gain some health benefits.  Behavioral modification strategies: increasing lean protein intake and decreasing simple carbohydrates.  Tonya Duran has agreed to follow-up with our clinic  in 2 weeks. She was informed of the importance of frequent follow-up visits to maximize her success with intensive lifestyle modifications for her multiple health conditions.   Objective:   Blood pressure (!) 145/90, pulse 89, temperature 98.2 F (36.8 C), height '5\' 4"'$  (1.626 m), weight 175 lb (79.4 kg), SpO2 95 %. Body mass index is 30.04 kg/m.  General: Cooperative, alert, well developed, in no acute distress. HEENT: Conjunctivae and lids unremarkable. Cardiovascular: Regular rhythm.  Lungs: Normal work of  breathing. Neurologic: No focal deficits.   Lab Results  Component Value Date   CREATININE 0.72 06/10/2022   BUN 8 06/10/2022   NA 136 06/10/2022   K 4.6 06/10/2022   CL 98 06/10/2022   CO2 23 06/10/2022   Lab Results  Component Value Date   ALT 27 06/10/2022   AST 24 06/10/2022   ALKPHOS 99 06/10/2022   BILITOT 0.3 06/10/2022   Lab Results  Component Value Date   HGBA1C 5.6 06/10/2022   HGBA1C 6.0 01/19/2022   Lab Results  Component Value Date   INSULIN 2.2 (L) 06/10/2022   Lab Results  Component Value Date   TSH 1.600 06/10/2022   Lab Results  Component Value Date   CHOL 186 06/10/2022   HDL 73 06/10/2022   LDLCALC 101 (H) 06/10/2022   TRIG 63 06/10/2022   CHOLHDL 2 01/19/2022   Lab Results  Component Value Date   VD25OH 28.0 (L) 06/10/2022   Lab Results  Component Value Date   WBC 6.0 06/10/2022   HGB 14.3 06/10/2022   HCT 43.6 06/10/2022   MCV 88 06/10/2022   PLT 336 06/10/2022   No results found for: "IRON", "TIBC", "FERRITIN"  Attestation Statements:   Reviewed by clinician on day of visit: allergies, medications, problem list, medical history, surgical history, family history, social history, and previous encounter notes.   Wilhemena Durie, am acting as transcriptionist for [employer], DO.   I have reviewed the above documentation for accuracy and completeness, and I agree with the above. Marjory Sneddon, D.O.  The [place] was signed into law in 2016 which includes the topic of electronic health records.  This provides immediate access to information in MyChart.  This includes consultation notes, operative notes, office notes, lab results and pathology reports.  If you have any questions about what you read please let us know at your next visit so we can discuss your concerns and take corrective action if need be.  We are right here with you.

## 2022-08-31 ENCOUNTER — Encounter: Payer: Self-pay | Admitting: Obstetrics and Gynecology

## 2022-08-31 ENCOUNTER — Other Ambulatory Visit (HOSPITAL_COMMUNITY)
Admission: RE | Admit: 2022-08-31 | Discharge: 2022-08-31 | Disposition: A | Discharge status: Home / Self Care | Payer: Managed Care, Other (non HMO) | Source: Ambulatory Visit | Attending: Obstetrics and Gynecology | Admitting: Obstetrics and Gynecology

## 2022-08-31 ENCOUNTER — Ambulatory Visit: Payer: Managed Care, Other (non HMO) | Admitting: Obstetrics and Gynecology

## 2022-08-31 VITALS — BP 112/82 | HR 66 | Ht 63.0 in | Wt 177.0 lb

## 2022-08-31 DIAGNOSIS — N939 Abnormal uterine and vaginal bleeding, unspecified: Secondary | ICD-10-CM

## 2022-08-31 DIAGNOSIS — Z9889 Other specified postprocedural states: Secondary | ICD-10-CM

## 2022-08-31 DIAGNOSIS — Z124 Encounter for screening for malignant neoplasm of cervix: Secondary | ICD-10-CM | POA: Insufficient documentation

## 2022-08-31 DIAGNOSIS — Z01419 Encounter for gynecological examination (general) (routine) without abnormal findings: Secondary | ICD-10-CM

## 2022-08-31 NOTE — Patient Instructions (Signed)

## 2022-08-31 NOTE — Progress Notes (Signed)
51 y.o. [id number] Married White or Caucasian Unavailable female here for annual exam. H/O endometrial ablation in 2020. Cycles got very light after that, now just very light spotting, not every month. She has mild vaginal dryness. No hot flashes, no night sweats.   She has occasional GSI.  She occasionally notices a vaginal bulge. Not painful. Empties her bladder fine. Typically BM's are fine, recently with mild constipation.  She recently started Wellbutrin for weight loss, also helping her mood.   Sexually active: Yes.    The current method of family planning is condoms most of the time.    Exercising: No.  The patient does not participate in regular exercise at present. Smoker:  no  Health Maintenance: Pap:  09/16/18 WNL Hr HPV Neg  History of abnormal Pap:  yes had a leep  MMG:  12/23/21 Bi-rads 1 neg  BMD:   n/a Colonoscopy: 06/30/22  f/u 7 years  TDaP:  06/21/19 Gardasil: n/a   reports that she quit smoking about 28 years ago. Her smoking use included cigarettes. She has never used smokeless tobacco. She reports current alcohol use. She reports that she does not use drugs. Just occasional ETOH. She works for a [employer]. Kids are 77, almost 48 and almost 12 year old.   Past Medical History:  Diagnosis Date   Abnormal uterine bleeding    Abnormal uterine bleeding (AUB) 10/13/2018   Attention deficit hyperactivity disorder (ADHD) 06/25/2020   B12 deficiency    Back pain    Breast cancer screening 03/02/2014   Chronic midline low back pain without sciatica 08/02/2019   ETD (eustachian tube dysfunction) 03/02/2014   Hypertension    No known health problems    Palpitations    Pre-diabetes    Right foot pain 06/19/2012   Right groin pain 02/23/2014   Right hip pain 03/05/2014   Tired    Visit for preventive health examination 03/02/2014   Vitamin D deficiency     Past Surgical History:  Procedure Laterality Date   dilatation and curettage  2001,  2016   [place] N/A 10/19/2018   Procedure: DILATATION & CURETTAGE/HYSTEROSCOPY WITH NOVASURE ABLATION;  Surgeon: Lavonia Drafts, MD;  Location: [facility];  Service: Gynecology;  Laterality: N/A;   LEEP  1995   WISDOM TOOTH EXTRACTION      Current Outpatient Medications  Medication Sig Dispense Refill   buPROPion (WELLBUTRIN SR) 150 MG 12 hr tablet Take 1 tablet (150 mg total) by mouth 2 (two) times daily. 60 tablet 0   Multiple Vitamin (MULTIVITAMIN) tablet Take 1 tablet by mouth daily.     Vitamin D, Ergocalciferol, (DRISDOL) 1.25 MG (50000 UNIT) CAPS capsule Take 1 capsule (50,000 Units total) by mouth every 7 (seven) days. 4 capsule 0   Current Facility-Administered Medications  Medication Dose Route Frequency Provider Last Rate Last Admin   0.9 %  sodium chloride infusion  500 mL Intravenous Once Pyrtle, Lajuan Lines, MD        Family History  Problem Relation Age of Onset   Breast cancer Mother 29   Lung cancer Mother        Living   Arthritis Mother    Obesity Mother    Lung cancer Father 74       Deceased   Alcohol abuse Father    Healthy Sister        half-sister   Healthy Brother        half-brother  Diabetes Paternal Uncle    Alzheimer's disease Maternal Grandmother    Healthy Daughter        x1   Asthma Son    Allergies Son    Healthy Son        #2   Sudden death Neg Hx    Hypertension Neg Hx    Hyperlipidemia Neg Hx    Heart attack Neg Hx    Colon cancer Neg Hx    Colon polyps Neg Hx    Esophageal cancer Neg Hx    Stomach cancer Neg Hx    Rectal cancer Neg Hx     Review of Systems  All other systems reviewed and are negative.   Exam:   BP 112/82   Pulse 66   Ht '5\' 3"'$  (1.6 m)   Wt 177 lb (80.3 kg)   SpO2 100%   BMI 31.35 kg/m   Weight change: '@WEIGHTCHANGE'$ @ Height:   Height: '5\' 3"'$  (160 cm)  Ht Readings from Last 3 Encounters:  08/31/22 '5\' 3"'$  (1.6 m)  08/04/22 '5\' 4"'$  (1.626 m)  07/21/22 '5\' 4"'$   (1.626 m)    General appearance: alert, cooperative and appears stated age Head: Normocephalic, without obvious abnormality, atraumatic Neck: no adenopathy, supple, symmetrical, trachea midline and thyroid normal to inspection and palpation Lungs: clear to auscultation bilaterally Cardiovascular: regular rate and rhythm Breasts: normal appearance, no masses or tenderness Abdomen: soft, non-tender; non distended,  no masses,  no organomegaly Extremities: extremities normal, atraumatic, no cyanosis or edema Skin: Skin color, texture, turgor normal. No rashes or lesions Lymph nodes: Cervical, supraclavicular, and axillary nodes normal. No abnormal inguinal nodes palpated Neurologic: Grossly normal   Pelvic: External genitalia:  no lesions              Urethra:  normal appearing urethra with no masses, tenderness or lesions              Bartholins and Skenes: normal                 Vagina: mildly atrophic appearing vagina with normal color and discharge, no lesions              Cervix: no lesions               Bimanual Exam:  Uterus:   no masses or tenderness              Adnexa: no mass, fullness, tenderness               Rectovaginal: Confirms               Anus:  normal sphincter tone, no lesions  Gae Dry, CMA chaperoned for the exam.  1. Well woman exam Discussed breast self exam Discussed calcium and vit D intake Screening labs with primary Mammogram and colonoscopy are UTD  2. History of endometrial ablation  3. Abnormal uterine bleeding (AUB) She isn't sure how often she is bleeding. She was given a menstrual calendar, will monitor her bleeding and send it in for review after several months - Follicle stimulating hormone  4. Screening for cervical cancer - Cytology - PAP

## 2022-09-01 LAB — FOLLICLE STIMULATING HORMONE: FSH: 24.1 m[IU]/mL

## 2022-09-08 ENCOUNTER — Ambulatory Visit (INDEPENDENT_AMBULATORY_CARE_PROVIDER_SITE_OTHER): Payer: Managed Care, Other (non HMO) | Admitting: Family Medicine

## 2022-09-08 ENCOUNTER — Encounter (INDEPENDENT_AMBULATORY_CARE_PROVIDER_SITE_OTHER): Payer: Self-pay | Admitting: Family Medicine

## 2022-09-08 VITALS — BP 108/75 | HR 56 | Temp 97.9°F | Ht 63.0 in | Wt 171.0 lb

## 2022-09-08 DIAGNOSIS — E559 Vitamin D deficiency, unspecified: Secondary | ICD-10-CM

## 2022-09-08 DIAGNOSIS — Z6829 Body mass index (BMI) 29.0-29.9, adult: Secondary | ICD-10-CM | POA: Diagnosis not present

## 2022-09-08 DIAGNOSIS — F39 Unspecified mood [affective] disorder: Secondary | ICD-10-CM

## 2022-09-08 DIAGNOSIS — E669 Obesity, unspecified: Secondary | ICD-10-CM | POA: Diagnosis not present

## 2022-09-08 MED ORDER — BUPROPION HCL ER (SR) 150 MG PO TB12: 150.0000 mg | ORAL_TABLET | Freq: Two times a day (BID) | ORAL | 0 refills | Status: DC

## 2022-09-08 MED ORDER — VITAMIN D (ERGOCALCIFEROL) 1.25 MG (50000 UNIT) PO CAPS: 50000.0000 [IU] | ORAL_CAPSULE | ORAL | 0 refills | Status: DC

## 2022-09-09 LAB — CYTOLOGY - PAP
Comment: NEGATIVE
Diagnosis: NEGATIVE
Diagnosis: REACTIVE
High risk HPV: NEGATIVE

## 2022-09-23 NOTE — Progress Notes (Signed)
Chief Complaint:   OBESITY Tonya Duran is here to discuss her progress with her obesity treatment plan along with follow-up of her obesity related diagnoses. Tonya Duran is on the Category 2 Plan and states she is following her eating plan approximately 90% of the time. Tonya Duran states she is doing 0 minutes 0 times per week.  Today's visit was #: 6 Starting weight: 183 lbs Starting date: 06/10/2022 Today's weight: 171 lbs Today's date: 09/08/2022 Total lbs lost to date: 12 Total lbs lost since last in-office visit: 4  Interim History: Tonya Duran continues to do well with weight loss. She has done well with increasing protein and her hunger is better controlled. She is open to discussing holiday eating strategies.   Subjective:   1. Vitamin D deficiency Tonya Duran is on Vitamin D, and her last level was too low. No side effects were noted.   2. Mood disorder, emotional eating behavior Tonya Duran feels her emotional eating behaviors have decreased on her medications. Her blood pressure is stable. She notes some constipation.   Assessment/Plan:   1. Vitamin D deficiency We will refill prescription Vitamin D 50,000 IU every week for 1 month. Tonya Duran will follow-up for routine testing of Vitamin D, at least 2-3 times per year to avoid over-replacement.  - Vitamin D, Ergocalciferol, (DRISDOL) 1.25 MG (50000 UNIT) CAPS capsule; Take 1 capsule (50,000 Units total) by mouth every 7 (seven) days.  Dispense: 4 capsule; Refill: 0  2. Mood disorder, emotional eating behavior We will refill Wellbutrin SR for 1 month. Tonya Duran is ok to use stool softeners and increase her water intake.   - buPROPion (WELLBUTRIN SR) 150 MG 12 hr tablet; Take 1 tablet (150 mg total) by mouth 2 (two) times daily.  Dispense: 60 tablet; Refill: 0  3. Obesity, Current BMI 29.4 Tonya Duran is currently in the action stage of change. As such, her goal is to continue with weight loss efforts. She has agreed to the Category 2  Plan.   We will recheck fasting labs at her next visit.   Behavioral modification strategies: increasing lean protein intake.  Tonya Duran has agreed to follow-up with our clinic in 5 weeks. She was informed of the importance of frequent follow-up visits to maximize her success with intensive lifestyle modifications for her multiple health conditions.   Objective:   Blood pressure 108/75, pulse (!) 56, temperature 97.9 F (36.6 C), height '5\' 3"'$  (1.6 m), weight 171 lb (77.6 kg), SpO2 99 %. Body mass index is 30.29 kg/m.  General: Cooperative, alert, well developed, in no acute distress. HEENT: Conjunctivae and lids unremarkable. Cardiovascular: Regular rhythm.  Lungs: Normal work of breathing. Neurologic: No focal deficits.   Lab Results  Component Value Date   CREATININE 0.72 06/10/2022   BUN 8 06/10/2022   NA 136 06/10/2022   K 4.6 06/10/2022   CL 98 06/10/2022   CO2 23 06/10/2022   Lab Results  Component Value Date   ALT 27 06/10/2022   AST 24 06/10/2022   ALKPHOS 99 06/10/2022   BILITOT 0.3 06/10/2022   Lab Results  Component Value Date   HGBA1C 5.6 06/10/2022   HGBA1C 6.0 01/19/2022   Lab Results  Component Value Date   INSULIN 2.2 (L) 06/10/2022   Lab Results  Component Value Date   TSH 1.600 06/10/2022   Lab Results  Component Value Date   CHOL 186 06/10/2022   HDL 73 06/10/2022   LDLCALC 101 (H) 06/10/2022   TRIG 63 06/10/2022  CHOLHDL 2 01/19/2022   Lab Results  Component Value Date   VD25OH 28.0 (L) 06/10/2022   Lab Results  Component Value Date   WBC 6.0 06/10/2022   HGB 14.3 06/10/2022   HCT 43.6 06/10/2022   MCV 88 06/10/2022   PLT 336 06/10/2022   No results found for: "IRON", "TIBC", "FERRITIN"  Attestation Statements:   Reviewed by clinician on day of visit: allergies, medications, problem list, medical history, surgical history, family history, social history, and previous encounter notes.   I, Trixie Dredge, am acting as  transcriptionist for Dennard Nip, MD.  I have reviewed the above documentation for accuracy and completeness, and I agree with the above. -  Dennard Nip, MD

## 2022-10-15 ENCOUNTER — Other Ambulatory Visit (INDEPENDENT_AMBULATORY_CARE_PROVIDER_SITE_OTHER): Payer: Self-pay | Admitting: Family Medicine

## 2022-10-15 ENCOUNTER — Ambulatory Visit (INDEPENDENT_AMBULATORY_CARE_PROVIDER_SITE_OTHER): Payer: Managed Care, Other (non HMO) | Admitting: Family Medicine

## 2022-10-15 ENCOUNTER — Encounter (INDEPENDENT_AMBULATORY_CARE_PROVIDER_SITE_OTHER): Payer: Self-pay | Admitting: Family Medicine

## 2022-10-15 VITALS — BP 99/68 | HR 74 | Temp 97.6°F | Ht 63.0 in | Wt 168.0 lb

## 2022-10-15 DIAGNOSIS — F39 Unspecified mood [affective] disorder: Secondary | ICD-10-CM | POA: Diagnosis not present

## 2022-10-15 DIAGNOSIS — E559 Vitamin D deficiency, unspecified: Secondary | ICD-10-CM | POA: Diagnosis not present

## 2022-10-15 DIAGNOSIS — Z6829 Body mass index (BMI) 29.0-29.9, adult: Secondary | ICD-10-CM

## 2022-10-15 DIAGNOSIS — E669 Obesity, unspecified: Secondary | ICD-10-CM | POA: Diagnosis not present

## 2022-10-15 DIAGNOSIS — R7303 Prediabetes: Secondary | ICD-10-CM

## 2022-10-15 IMAGING — MG MM DIGITAL SCREENING BILAT W/ TOMO AND CAD
6 of 10 series · 6 of 30 positions shown · non-contrast
Comparison: Previous exam(s).

CLINICAL DATA: Screening.

EXAM:
DIGITAL SCREENING BILATERAL MAMMOGRAM WITH TOMOSYNTHESIS AND CAD
TECHNIQUE: Bilateral screening digital craniocaudal and mediolateral oblique
mammograms were obtained. Bilateral screening digital breast
tomosynthesis was performed. The images were evaluated with
computer-aided detection.

[L CC synth-2D]
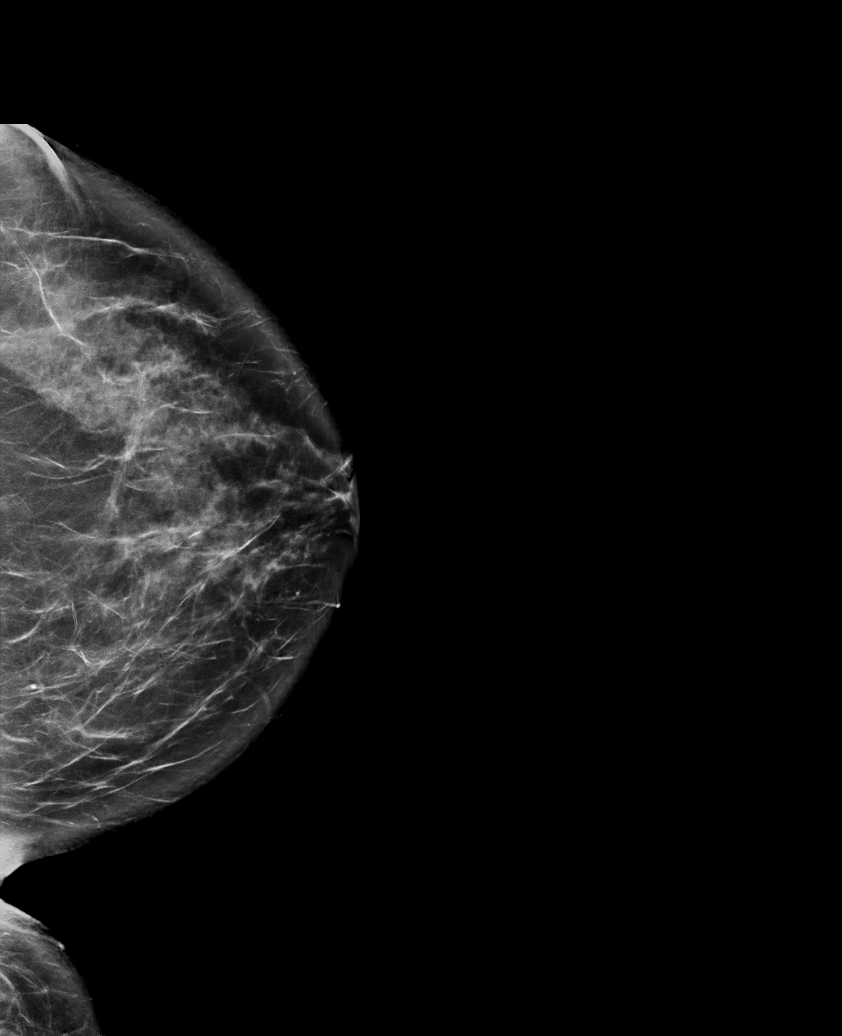

[R CC synth-2D]
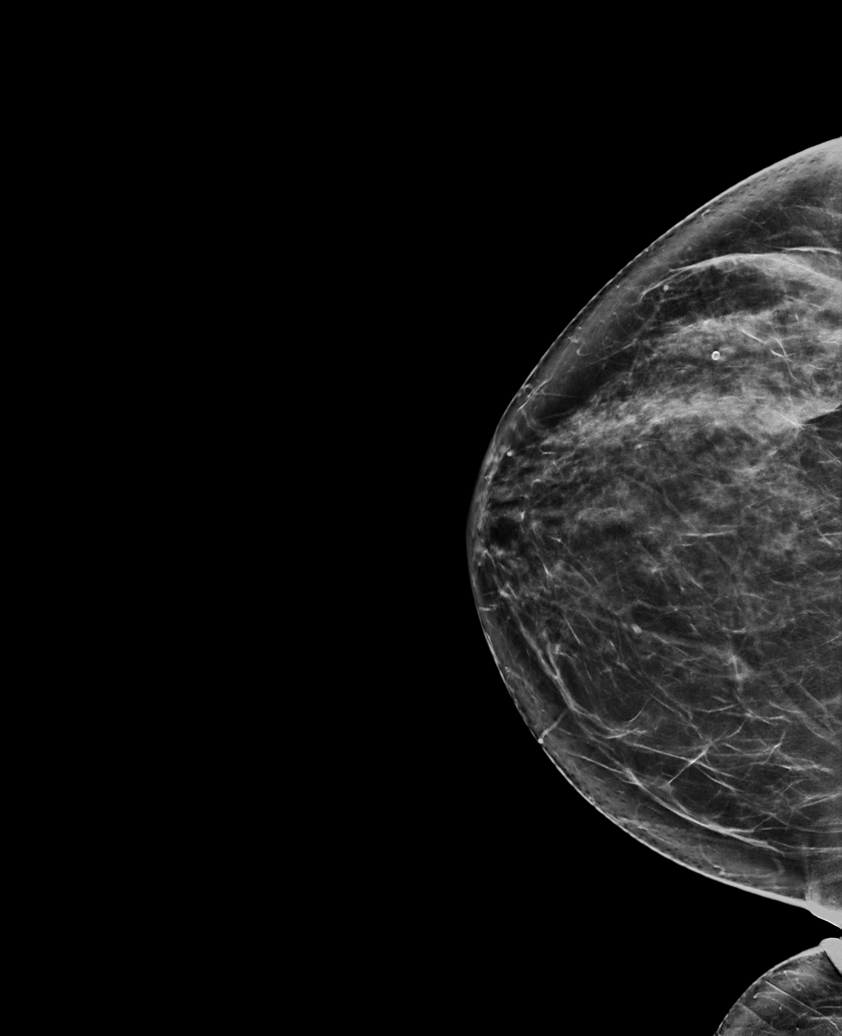

[L MLO synth-2D]
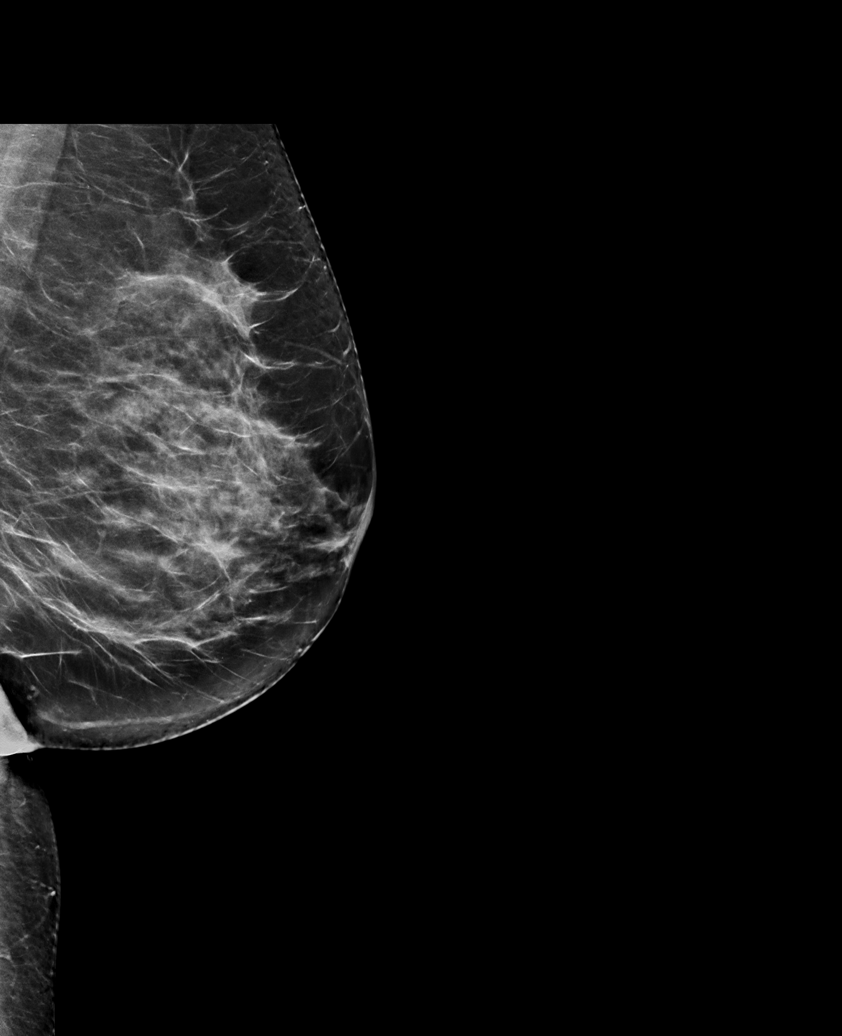

[R XCCL synth-2D]
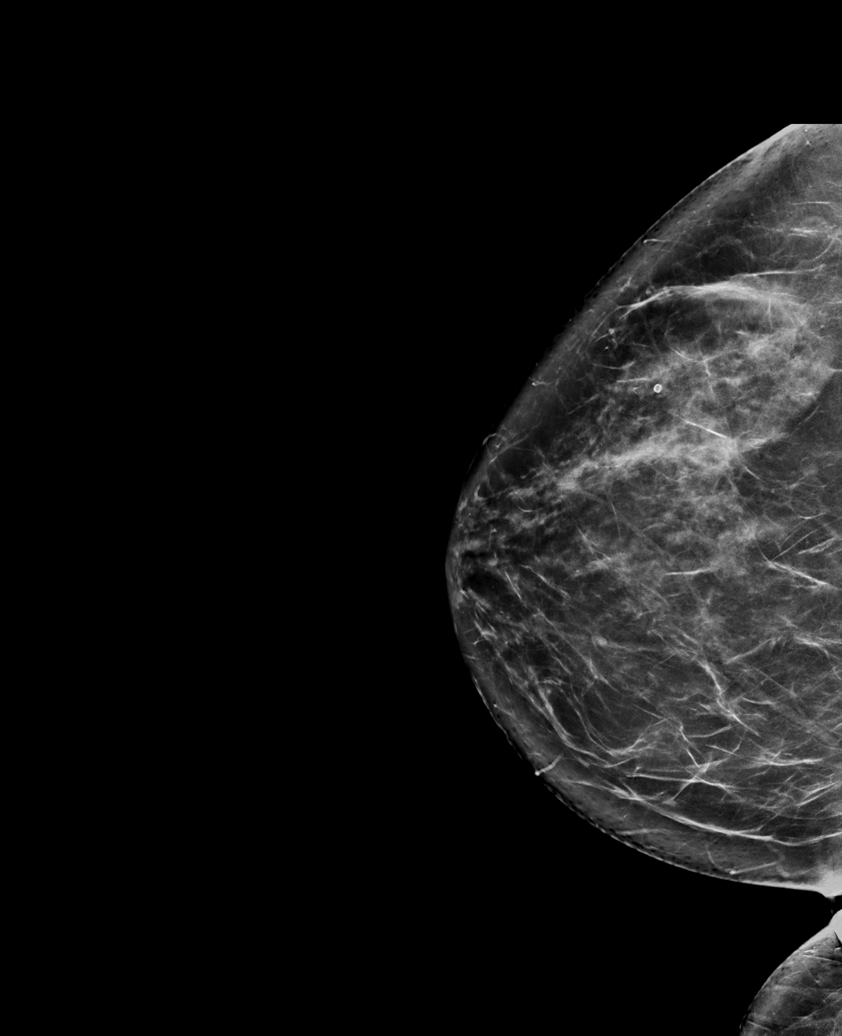

[R MLO synth-2D]
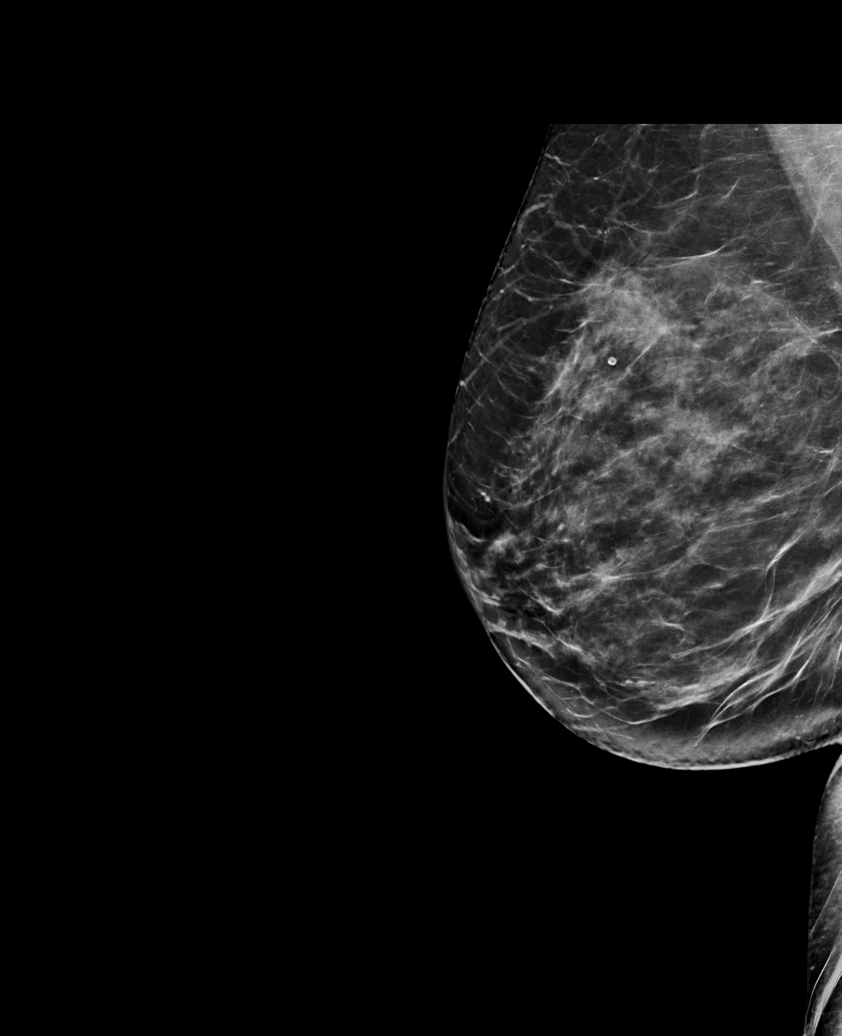

[R XCCL tomo · tomo slice 43/85.0]
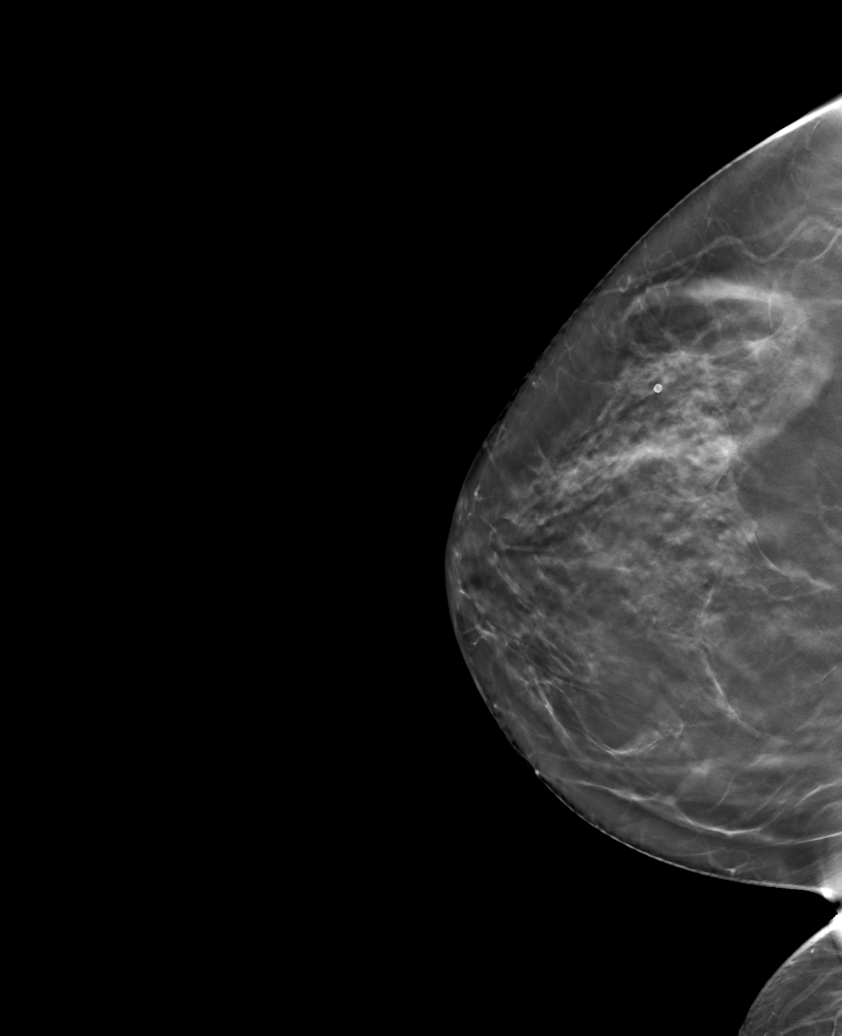

[6 of 30 positions shown; findings below may reference images not displayed]

ACR Breast Density Category c: The breast tissue is heterogeneously
dense, which may obscure small masses.
FINDINGS: There are no findings suspicious for malignancy.
IMPRESSION: No mammographic evidence of malignancy. A result letter of this
screening mammogram will be mailed directly to the patient.

RECOMMENDATION:
Screening mammogram in one year. (Code:Q3-W-BC3)

BI-RADS CATEGORY  1: Negative.

## 2022-10-15 MED ORDER — VITAMIN D (ERGOCALCIFEROL) 1.25 MG (50000 UNIT) PO CAPS: 50000.0000 [IU] | ORAL_CAPSULE | ORAL | 0 refills | Status: DC

## 2022-10-15 MED ORDER — BUPROPION HCL ER (SR) 200 MG PO TB12: 200.0000 mg | ORAL_TABLET | Freq: Two times a day (BID) | ORAL | 0 refills | Status: DC

## 2022-10-16 LAB — LIPID PANEL WITH LDL/HDL RATIO
Cholesterol, Total: 173 mg/dL (ref 100–199)
HDL: 62 mg/dL (ref >39)
LDL Chol Calc (NIH): 98 mg/dL (ref 0–99)
LDL/HDL Ratio: 1.6 ratio (ref 0.0–3.2)
Triglycerides: 65 mg/dL (ref 0–149)
VLDL Cholesterol Cal: 13 mg/dL (ref 5–40)

## 2022-10-16 LAB — VITAMIN D 25 HYDROXY (VIT D DEFICIENCY, FRACTURES): Vit D, 25-Hydroxy: 41.1 ng/mL (ref 30.0–100.0)

## 2022-10-16 LAB — INSULIN, RANDOM: INSULIN: 5.5 u[IU]/mL (ref 2.6–24.9)

## 2022-10-16 LAB — CMP14+EGFR
ALT: 19 IU/L (ref 0–32)
AST: 19 IU/L (ref 0–40)
Albumin/Globulin Ratio: 1.5 (ref 1.2–2.2)
Albumin: 4.4 g/dL (ref 3.8–4.9)
Alkaline Phosphatase: 90 IU/L (ref 44–121)
BUN/Creatinine Ratio: 21 (ref 9–23)
BUN: 15 mg/dL (ref 6–24)
Bilirubin Total: 0.3 mg/dL (ref 0.0–1.2)
CO2: 23 mmol/L (ref 20–29)
Calcium: 9.7 mg/dL (ref 8.7–10.2)
Chloride: 100 mmol/L (ref 96–106)
Creatinine, Ser: 0.72 mg/dL (ref 0.57–1.00)
Globulin, Total: 3 g/dL (ref 1.5–4.5)
Glucose: 88 mg/dL (ref 70–99)
Potassium: 4.9 mmol/L (ref 3.5–5.2)
Sodium: 137 mmol/L (ref 134–144)
Total Protein: 7.4 g/dL (ref 6.0–8.5)
eGFR: 101 mL/min/{1.73_m2} (ref >59)

## 2022-10-16 LAB — HEMOGLOBIN A1C
Est. average glucose Bld gHb Est-mCnc: 120 mg/dL
Hgb A1c MFr Bld: 5.8 % — ABNORMAL HIGH (ref 4.8–5.6)

## 2022-10-20 NOTE — Progress Notes (Unsigned)
Chief Complaint:   OBESITY Tonya Duran is here to discuss her progress with her obesity treatment plan along with follow-up of her obesity related diagnoses. Tonya Duran is on {MWMwtlossportion/plan2:23431} and states she is following her eating plan approximately ***% of the time. Tonya Duran states she is *** *** minutes *** times per week.  Today's visit was #: *** Starting weight: *** Starting date: *** Today's weight: *** Today's date: 10/15/2022 Total lbs lost to date: *** Total lbs lost since last in-office visit: ***  Interim History: ***  Subjective:   1. Prediabetes ***  2. Vitamin D deficiency ***  3. Emotional Eating Behavior ***  Assessment/Plan:   1. Prediabetes *** - Hemoglobin A1c - Insulin, random - Lipid Panel With LDL/HDL Ratio - CMP14+EGFR  2. Vitamin D deficiency *** - Vitamin D, Ergocalciferol, (DRISDOL) 1.25 MG (50000 UNIT) CAPS capsule; Take 1 capsule (50,000 Units total) by mouth every 7 (seven) days.  Dispense: 4 capsule; Refill: 0 - VITAMIN D 25 Hydroxy (Vit-D Deficiency, Fractures)  3. Emotional Eating Behavior *** - buPROPion (WELLBUTRIN SR) 200 MG 12 hr tablet; Take 1 tablet (200 mg total) by mouth 2 (two) times daily.  Dispense: 60 tablet; Refill: 0  4. BMI 29.0-29.9,adult  5. Obesity, Beginning BMI 31.76 Tonya Duran is currently in the action stage of change. As such, her goal is to continue with weight loss efforts. She has agreed to the Category 2 Plan.   Exercise goals: Patient is to try wall pilate videos.   Behavioral modification strategies: increasing lean protein intake.  Tonya Duran has agreed to follow-up with our clinic in 4 weeks. She was informed of the importance of frequent follow-up visits to maximize her success with intensive lifestyle modifications for her multiple health conditions.   Tonya Duran was informed we would discuss her lab results at her next visit unless there is a critical issue that needs to be addressed  sooner. Tonya Duran agreed to keep her next visit at the agreed upon time to discuss these results.  Objective:   Blood pressure 99/68, pulse 74, temperature 97.6 F (36.4 C), height '5\' 3"'$  (1.6 m), weight 168 lb (76.2 kg), SpO2 97 %. Body mass index is 29.76 kg/m.  General: Cooperative, alert, well developed, in no acute distress. HEENT: Conjunctivae and lids unremarkable. Cardiovascular: Regular rhythm.  Lungs: Normal work of breathing. Neurologic: No focal deficits.   Lab Results  Component Value Date   CREATININE 0.72 10/15/2022   BUN 15 10/15/2022   NA 137 10/15/2022   K 4.9 10/15/2022   CL 100 10/15/2022   CO2 23 10/15/2022   Lab Results  Component Value Date   ALT 19 10/15/2022   AST 19 10/15/2022   ALKPHOS 90 10/15/2022   BILITOT 0.3 10/15/2022   Lab Results  Component Value Date   HGBA1C 5.8 (H) 10/15/2022   HGBA1C 5.6 06/10/2022   HGBA1C 6.0 01/19/2022   Lab Results  Component Value Date   INSULIN 5.5 10/15/2022   INSULIN 2.2 (L) 06/10/2022   Lab Results  Component Value Date   TSH 1.600 06/10/2022   Lab Results  Component Value Date   CHOL 173 10/15/2022   HDL 62 10/15/2022   LDLCALC 98 10/15/2022   TRIG 65 10/15/2022   CHOLHDL 2 01/19/2022   Lab Results  Component Value Date   VD25OH 41.1 10/15/2022   VD25OH 28.0 (L) 06/10/2022   Lab Results  Component Value Date   WBC 6.0 06/10/2022   HGB 14.3 06/10/2022  HCT 43.6 06/10/2022   MCV 88 06/10/2022   PLT 336 06/10/2022   No results found for: "IRON", "TIBC", "FERRITIN"  Attestation Statements:   Reviewed by clinician on day of visit: allergies, medications, problem list, medical history, surgical history, family history, social history, and previous encounter notes.   I, Trixie Dredge, am acting as transcriptionist for Dennard Nip, MD.  I have reviewed the above documentation for accuracy and completeness, and I agree with the above. -  ***

## 2022-10-26 ENCOUNTER — Ambulatory Visit: Payer: Managed Care, Other (non HMO) | Admitting: Family Medicine

## 2022-10-26 ENCOUNTER — Encounter: Payer: Self-pay | Admitting: Family Medicine

## 2022-10-26 VITALS — BP 120/68 | HR 85 | Temp 98.1°F | Ht 63.0 in | Wt 172.1 lb

## 2022-10-26 DIAGNOSIS — F39 Unspecified mood [affective] disorder: Secondary | ICD-10-CM | POA: Diagnosis not present

## 2022-10-26 DIAGNOSIS — M25512 Pain in left shoulder: Secondary | ICD-10-CM

## 2022-10-26 MED ORDER — BUPROPION HCL ER (SR) 200 MG PO TB12: 200.0000 mg | ORAL_TABLET | Freq: Two times a day (BID) | ORAL | 1 refills | Status: DC

## 2022-10-26 NOTE — Progress Notes (Signed)
Musculoskeletal Exam  Patient: Tonya Duran DOB: June 02, 1971  DOS: 10/26/2022  SUBJECTIVE:  Chief Complaint:   Chief Complaint  Patient presents with   Shoulder Pain    Left     Tonya Duran is a 52 y.o.  female for evaluation and treatment of L shoulder pain.   Onset:  1 month ago. No inj or change in activity.  Location: front of shoulder, feels deep Character:  burning, dull, and sharp  Progression of issue:  has slightly improved Associated symptoms: Slightly decreased ROM 2/2 pain No bruising, redness, swelling Treatment: to date has been OTC NSAIDS.   Neurovascular symptoms: no  Past Medical History:  Diagnosis Date   Abnormal uterine bleeding    Abnormal uterine bleeding (AUB) 10/13/2018   Attention deficit hyperactivity disorder (ADHD) 06/25/2020   B12 deficiency    Back pain    Breast cancer screening 03/02/2014   Chronic midline low back pain without sciatica 08/02/2019   ETD (eustachian tube dysfunction) 03/02/2014   Hypertension    No known health problems    Palpitations    Pre-diabetes    Right foot pain 06/19/2012   Right groin pain 02/23/2014   Right hip pain 03/05/2014   Tired    Visit for preventive health examination 03/02/2014   Vitamin D deficiency     Objective: VITAL SIGNS: BP 120/68 (BP Location: Left Arm, Patient Position: Sitting, Cuff Size: Normal)   Pulse 85   Temp 98.1 F (36.7 C) (Oral)   Ht '5\' 3"'$  (1.6 m)   Wt 172 lb 2 oz (78.1 kg)   SpO2 99%   BMI 30.49 kg/m  Constitutional: Well formed, well developed. No acute distress. Thorax & Lungs: No accessory muscle use Musculoskeletal: L shoulder.   Normal active range of motion: yes.   Normal passive range of motion: yes Tenderness to palpation: Yes, over the coracoid process Deformity: no Ecchymosis: no Tests positive: Speeds Tests negative: Crossover, Hawkins, O'Briens, liftoff, empty can, Neer's Neurologic: Normal sensory function. No focal deficits noted. DTR's  equal and symmetric in UE's. No clonus. Psychiatric: Normal mood. Age appropriate judgment and insight. Alert & oriented x 3.    Assessment:  Left shoulder pain, unspecified chronicity  Emotional Eating Behavior - Plan: buPROPion (WELLBUTRIN SR) 200 MG 12 hr tablet  Plan: Suspect proximal biceps tendinitis.  Stretches/exercises, heat, ice, Tylenol.  PT if no better. F/u as originally scheduled for CPE. The patient voiced understanding and agreement to the plan.   [place], DO 10/26/22  9:41 AM

## 2022-10-26 NOTE — Patient Instructions (Signed)
Ice/cold pack over area for 10-15 min twice daily.  Heat (pad or rice pillow in microwave) over affected area, 10-15 minutes twice daily.   OK to take Tylenol 1000 mg (2 extra strength tabs) or 975 mg (3 regular strength tabs) every 6 hours as needed.  Ibuprofen 400-600 mg (2-3 over the counter strength tabs) every 6 hours as needed for pain.  Let us know if you need anything.  Biceps Tendon Disruption (Proximal) Rehab Do exercises exactly as told by your health care provider and adjust them as directed. It is normal to feel mild stretching, pulling, tightness, or discomfort as you do these exercises, but you should stop right away if you feel sudden pain or your pain gets worse.  Stretching and range of motion exercises These exercises warm up your muscles and joints and improve the movement and flexibility of your arm and shoulder. These exercises also help to relieve pain and stiffness. Exercise A: Shoulder flexion, standing   Stand facing a wall. Put your left / right hand on the wall. Slide your left / right hand up the wall. Stop when you feel a stretch in your shoulder, or when you reach the angle recommended by your health care provider. Use your other hand to help raise your arm, if needed. As your hand gets higher, you may need to step closer to the wall. Avoid shrugging your shoulder while you raise your arm. To do this, keep your shoulder blade tucked down toward your spine. Hold for 30 seconds. Slowly return to the starting position. Use your other arm to help, if needed. Repeat 2 times. Complete this exercise 3 times per week. Exercise B: Pendulum   Stand near a wall or a surface that you can hold onto for balance. Bend at the waist and let your left / right arm hang straight down. Use your other arm to support you. Relax your arm and shoulder muscles, and move your hips and your trunk so your left / right arm swings freely. Your arm should swing because of the motion of  your body, not because you are using your arm or shoulder muscles. Keep moving so your arm swings in the following directions, as told by your health care provider: Side to side. Forward and backward. In clockwise and counterclockwise circles. Slowly return to the starting position. Repeat 2 times. Complete this exercise 3 times per week.  Strengthening exercises These exercises build strength and endurance in your arm and shoulder. Endurance is the ability to use your muscles for a long time, even after your muscles get tired. Exercise C: Elbow flexion, neutral  Sit on a stable chair without armrests, or stand. Hold a 3-5 lb weight in your left / right hand, or hold an exercise band with both hands. Your palms should face each other at the starting position. Bend your left / right elbow and move your hand up toward your shoulder. Lead with your thumb, and keep your palm facing the same direction. Keep your other arm straight down, in the starting position. Slowly return to the starting position. Repeat 2-3 times. Complete this exercise 3 times per week. Exercise D: Forearm supination   Sit with your left / right forearm on a table. Your elbow should be below shoulder height. Rest your hand over the edge of the table so your palm faces down. If directed, hold a hammer with your left / right hand. Without moving your elbow, slowly rotate your hand so your palm faces  up toward the ceiling. If you are holding a hammer, begin by holding the hammer near the head. When this exercise gets easier for you, hold the hammer farther down the handle. Hold for 3 seconds. Slowly return to the starting position. Repeat 2 times. Complete this exercise 3 times per week. Exercise E: Scapular retraction   Sit in a stable chair without armrests, or stand. Secure an exercise band to a stable object in front of you so the band is at shoulder height. Hold one end of the exercise band in each hand. Squeeze  your shoulder blades together and move your elbows slightly behind you. Do not shrug your shoulders. Hold for 3 seconds. Slowly return to the starting position. Repeat 2 times. Complete this exercise 3 times per week. Exercise F: Scapular protraction, supine   Lie on your back on a firm surface. Hold a 3-5 lb weight in your left / right hand. Raise your left / right arm straight into the air so your hand is directly above your shoulder joint. Push the weight into the air so your shoulder lifts off of the surface that you are lying on. Do not move your head, neck, or back. Hold for 3 seconds. Slowly return to the starting position. Let your muscles relax completely before you repeat this exercise. Repeat 2 times. Complete this exercise 3 times per week. This information is not intended to replace advice given to you by your health care provider. Make sure you discuss any questions you have with your health care provider. Document Released: 09/14/2005 Document Revised: 05/21/2016 Document Reviewed: 08/23/2015 Elsevier Interactive Patient Education  2017 [employer].

## 2022-11-10 ENCOUNTER — Encounter: Payer: Self-pay | Admitting: Family Medicine

## 2022-11-10 DIAGNOSIS — F39 Unspecified mood [affective] disorder: Secondary | ICD-10-CM

## 2022-11-10 MED ORDER — BUPROPION HCL ER (SR) 200 MG PO TB12: 200.0000 mg | ORAL_TABLET | Freq: Two times a day (BID) | ORAL | 0 refills | Status: DC

## 2022-11-10 NOTE — Addendum Note (Signed)
Addended by: Sharon Seller B on: 11/10/2022 04:47 PM   Modules accepted: Orders

## 2022-11-12 MED ORDER — BUPROPION HCL ER (SR) 200 MG PO TB12: 200.0000 mg | ORAL_TABLET | Freq: Two times a day (BID) | ORAL | 0 refills | Status: DC

## 2022-11-12 NOTE — Addendum Note (Signed)
Addended byDamita Dunnings D on: 11/12/2022 08:52 AM   Modules accepted: Orders

## 2022-11-13 ENCOUNTER — Encounter (INDEPENDENT_AMBULATORY_CARE_PROVIDER_SITE_OTHER): Payer: Self-pay | Admitting: Family Medicine

## 2022-11-16 NOTE — Telephone Encounter (Signed)
Appt has been cancelled.  

## 2022-11-17 ENCOUNTER — Ambulatory Visit (INDEPENDENT_AMBULATORY_CARE_PROVIDER_SITE_OTHER): Payer: Managed Care, Other (non HMO) | Admitting: Family Medicine

## 2023-01-11 ENCOUNTER — Encounter: Payer: Self-pay | Admitting: *Deleted

## 2023-02-02 ENCOUNTER — Encounter: Payer: Self-pay | Admitting: Family Medicine

## 2023-02-02 ENCOUNTER — Ambulatory Visit (INDEPENDENT_AMBULATORY_CARE_PROVIDER_SITE_OTHER): Payer: Managed Care, Other (non HMO) | Admitting: Family Medicine

## 2023-02-02 ENCOUNTER — Other Ambulatory Visit: Payer: Self-pay | Admitting: Family Medicine

## 2023-02-02 VITALS — BP 106/62 | HR 74 | Temp 98.4°F | Ht 63.0 in | Wt 174.5 lb

## 2023-02-02 DIAGNOSIS — E559 Vitamin D deficiency, unspecified: Secondary | ICD-10-CM

## 2023-02-02 DIAGNOSIS — Z0000 Encounter for general adult medical examination without abnormal findings: Secondary | ICD-10-CM

## 2023-02-02 DIAGNOSIS — G8929 Other chronic pain: Secondary | ICD-10-CM

## 2023-02-02 DIAGNOSIS — M25512 Pain in left shoulder: Secondary | ICD-10-CM

## 2023-02-02 DIAGNOSIS — R7303 Prediabetes: Secondary | ICD-10-CM | POA: Diagnosis not present

## 2023-02-02 LAB — COMPREHENSIVE METABOLIC PANEL
ALT: 28 U/L (ref 0–35)
AST: 21 U/L (ref 0–37)
Albumin: 4.2 g/dL (ref 3.5–5.2)
Alkaline Phosphatase: 85 U/L (ref 39–117)
BUN: 10 mg/dL (ref 6–23)
CO2: 28 mEq/L (ref 19–32)
Calcium: 9.1 mg/dL (ref 8.4–10.5)
Chloride: 101 mEq/L (ref 96–112)
Creatinine, Ser: 0.69 mg/dL (ref 0.40–1.20)
GFR: 99.99 mL/min (ref >60.00)
Glucose, Bld: 91 mg/dL (ref 70–99)
Potassium: 4.5 mEq/L (ref 3.5–5.1)
Sodium: 137 mEq/L (ref 135–145)
Total Bilirubin: 0.3 mg/dL (ref 0.2–1.2)
Total Protein: 7 g/dL (ref 6.0–8.3)

## 2023-02-02 LAB — HEMOGLOBIN A1C: Hgb A1c MFr Bld: 5.8 % (ref 4.6–6.5)

## 2023-02-02 LAB — LIPID PANEL
Cholesterol: 150 mg/dL (ref 0–200)
HDL: 65.3 mg/dL (ref >39.00)
LDL Cholesterol: 70 mg/dL (ref 0–99)
NonHDL: 84.53
Total CHOL/HDL Ratio: 2
Triglycerides: 73 mg/dL (ref 0.0–149.0)
VLDL: 14.6 mg/dL (ref 0.0–40.0)

## 2023-02-02 LAB — CBC
HCT: 42.1 % (ref 36.0–46.0)
Hemoglobin: 14.2 g/dL (ref 12.0–15.0)
MCHC: 33.7 g/dL (ref 30.0–36.0)
MCV: 88 fl (ref 78.0–100.0)
Platelets: 362 10*3/uL (ref 150.0–400.0)
RBC: 4.79 Mil/uL (ref 3.87–5.11)
RDW: 13 % (ref 11.5–15.5)
WBC: 6.9 10*3/uL (ref 4.0–10.5)

## 2023-02-02 LAB — VITAMIN D 25 HYDROXY (VIT D DEFICIENCY, FRACTURES): VITD: 24.65 ng/mL — ABNORMAL LOW (ref 30.00–100.00)

## 2023-02-02 MED ORDER — VITAMIN D (ERGOCALCIFEROL) 1.25 MG (50000 UNIT) PO CAPS: 50000.0000 [IU] | ORAL_CAPSULE | ORAL | 0 refills | Status: DC

## 2023-02-02 MED ORDER — FLUTICASONE PROPIONATE 50 MCG/ACT NA SUSP: 2.0000 | Freq: Every day | NASAL | 6 refills | Status: DC

## 2023-02-02 NOTE — Patient Instructions (Addendum)
Give [place] 2-3 business days to get the results of your labs back.   Keep the diet clean and stay active.  Please get me a copy of your advanced directive form at your convenience.   Claritin (loratadine), Allegra (fexofenadine), Zyrtec (cetirizine) which is also equivalent to Xyzal (levocetirizine); these are listed in order from weakest to strongest. Generic, and therefore cheaper, options are in the parentheses.   Flonase (fluticasone); nasal spray that is over the counter. 2 sprays each nostril, once daily. Aim towards the same side eye when you spray.  There are available OTC, and the generic versions, which may be cheaper, are in parentheses. Show this to a pharmacist if you have trouble finding any of these items.  If you do not hear anything about your referral in the next 1-2 weeks, call our office and ask for an update.  Let us know if you need anything.  EXERCISES  RANGE OF MOTION (ROM) AND STRETCHING EXERCISES These exercises may help you when beginning to rehabilitate your injury. While completing these exercises, remember:  Restoring tissue flexibility helps normal motion to return to the joints. This allows healthier, less painful movement and activity. An effective stretch should be held for at least 30 seconds. A stretch should never be painful. You should only feel a gentle lengthening or release in the stretched tissue.  ROM - Pendulum Bend at the waist so that your right / left arm falls away from your body. Support yourself with your opposite hand on a solid surface, such as a table or a countertop. Your right / left arm should be perpendicular to the ground. If it is not perpendicular, you need to lean over farther. Relax the muscles in your right / left arm and shoulder as much as possible. Gently sway your hips and trunk so they move your right / left arm without any use of your right / left shoulder muscles. Progress your movements so that your right / left arm moves  side to side, then forward and backward, and finally, both clockwise and counterclockwise. Complete 10-15 repetitions in each direction. Many people use this exercise to relieve discomfort in their shoulder as well as to gain range of motion. Repeat 2 times. Complete this exercise 3 times per week.  STRETCH - Flexion, Standing Stand with good posture. With an underhand grip on your right / left hand and an overhand grip on the opposite hand, grasp a broomstick or cane so that your hands are a little more than shoulder-width apart. Keeping your right / left elbow straight and shoulder muscles relaxed, push the stick with your opposite hand to raise your right / left arm in front of your body and then overhead. Raise your arm until you feel a stretch in your right / left shoulder, but before you have increased shoulder pain. Try to avoid shrugging your right / left shoulder as your arm rises by keeping your shoulder blade tucked down and toward your mid-back spine. Hold 30 seconds. Slowly return to the starting position. Repeat 2 times. Complete this exercise 3 times per week.  STRETCH - Internal Rotation Place your right / left hand behind your back, palm-up. Throw a towel or belt over your opposite shoulder. Grasp the towel/belt with your right / left hand. While keeping an upright posture, gently pull up on the towel/belt until you feel a stretch in the front of your right / left shoulder. Avoid shrugging your right / left shoulder as your arm rises  by keeping your shoulder blade tucked down and toward your mid-back spine. Hold 30. Release the stretch by lowering your opposite hand. Repeat 2 times. Complete this exercise 3 times per week.  STRETCH - External Rotation and Abduction Stagger your stance through a doorframe. It does not matter which foot is forward. As instructed by your physician, physical therapist or athletic trainer, place your hands: And forearms above your head and on the  door frame. And forearms at head-height and on the door frame. At elbow-height and on the door frame. Keeping your head and chest upright and your stomach muscles tight to prevent over-extending your low-back, slowly shift your weight onto your front foot until you feel a stretch across your chest and/or in the front of your shoulders. Hold 30 seconds. Shift your weight to your back foot to release the stretch. Repeat 2 times. Complete this stretch 3 times per week.   STRENGTHENING EXERCISES  These exercises may help you when beginning to rehabilitate your injury. They may resolve your symptoms with or without further involvement from your physician, physical therapist or athletic trainer. While completing these exercises, remember:  Muscles can gain both the endurance and the strength needed for everyday activities through controlled exercises. Complete these exercises as instructed by your physician, physical therapist or athletic trainer. Progress the resistance and repetitions only as guided. You may experience muscle soreness or fatigue, but the pain or discomfort you are trying to eliminate should never worsen during these exercises. If this pain does worsen, stop and make certain you are following the directions exactly. If the pain is still present after adjustments, discontinue the exercise until you can discuss the trouble with your clinician. If advised by your physician, during your recovery, avoid activity or exercises which involve actions that place your right / left hand or elbow above your head or behind your back or head. These positions stress the tissues which are trying to heal.  STRENGTH - Scapular Depression and Adduction With good posture, sit on a firm chair. Supported your arms in front of you with pillows, arm rests or a table top. Have your elbows in line with the sides of your body. Gently draw your shoulder blades down and toward your mid-back spine. Gradually increase  the tension without tensing the muscles along the top of your shoulders and the back of your neck. Hold for 3 seconds. Slowly release the tension and relax your muscles completely before completing the next repetition. After you have practiced this exercise, remove the arm support and complete it in standing as well as sitting. Repeat 2 times. Complete this exercise 3 times per week.   STRENGTH - External Rotators Secure a rubber exercise band/tubing to a fixed object so that it is at the same height as your right / left elbow when you are standing or sitting on a firm surface. Stand or sit so that the secured exercise band/tubing is at your side that is not injured. Bend your elbow 90 degrees. Place a folded towel or small pillow under your right / left arm so that your elbow is a few inches away from your side. Keeping the tension on the exercise band/tubing, pull it away from your body, as if pivoting on your elbow. Be sure to keep your body steady so that the movement is only coming from your shoulder rotating. Hold 3 seconds. Release the tension in a controlled manner as you return to the starting position. Repeat 2 times. Complete this exercise  3 times per week.   STRENGTH - Supraspinatus Stand or sit with good posture. Grasp a 2-3 lb weight or an exercise band/tubing so that your hand is "thumbs-up," like when you shake hands. Slowly lift your right / left hand from your thigh into the air, traveling about 30 degrees from straight out at your side. Lift your hand to shoulder height or as far as you can without increasing any shoulder pain. Initially, many people do not lift their hands above shoulder height. Avoid shrugging your right / left shoulder as your arm rises by keeping your shoulder blade tucked down and toward your mid-back spine. Hold for 3 seconds. Control the descent of your hand as you slowly return to your starting position. Repeat 2 times. Complete this exercise 3 times per  week.   STRENGTH - Shoulder Extensors Secure a rubber exercise band/tubing so that it is at the height of your shoulders when you are either standing or sitting on a firm arm-less chair. With a thumbs-up grip, grasp an end of the band/tubing in each hand. Straighten your elbows and lift your hands straight in front of you at shoulder height. Step back away from the secured end of band/tubing until it becomes tense. Squeezing your shoulder blades together, pull your hands down to the sides of your thighs. Do not allow your hands to go behind you. Hold for 3 seconds. Slowly ease the tension on the band/tubing as you reverse the directions and return to the starting position. Repeat 2 times. Complete this exercise 3 times per week.   STRENGTH - Scapular Retractors Secure a rubber exercise band/tubing so that it is at the height of your shoulders when you are either standing or sitting on a firm arm-less chair. With a palm-down grip, grasp an end of the band/tubing in each hand. Straighten your elbows and lift your hands straight in front of you at shoulder height. Step back away from the secured end of band/tubing until it becomes tense. Squeezing your shoulder blades together, draw your elbows back as you bend them. Keep your upper arm lifted away from your body throughout the exercise. Hold 3 seconds. Slowly ease the tension on the band/tubing as you reverse the directions and return to the starting position. Repeat 2 times. Complete this exercise 3 times per week.  STRENGTH - Scapular Depressors Find a sturdy chair without wheels, such as a from a dining room table. Keeping your feet on the floor, lift your bottom from the seat and lock your elbows. Keeping your elbows straight, allow gravity to pull your body weight down. Your shoulders will rise toward your ears. Raise your body against gravity by drawing your shoulder blades down your back, shortening the distance between your shoulders and  ears. Although your feet should always maintain contact with the floor, your feet should progressively support less body weight as you get stronger. Hold 3 seconds. In a controlled and slow manner, lower your body weight to begin the next repetition. Repeat 2 times. Complete this exercise 3 times per week.    This information is not intended to replace advice given to you by your health care provider. Make sure you discuss any questions you have with your health care provider.   Document Released: 07/29/2005 Document Revised: 10/05/2014 Document Reviewed: 12/27/2008 Elsevier Interactive Patient Education [employer]

## 2023-02-02 NOTE — Progress Notes (Signed)
Chief Complaint  Patient presents with   Annual Exam   Shoulder Pain     Well Woman Tonya Duran is here for a complete physical.   Her last physical was >1 year ago.  Current diet: in general, a "healthy" diet. Current exercise: limited. Weight is stable and she denies fatigue out of ordinary. Seatbelt? Yes Advanced directive? No  Health Maintenance Pap/HPV- Yes Mammogram- Yes Colon cancer screening-Yes Shingrix- N/A Tetanus- Yes Hep C screening- Yes HIV screening- Yes  Past Medical History:  Diagnosis Date   Abnormal uterine bleeding    Abnormal uterine bleeding (AUB) 10/13/2018   Attention deficit hyperactivity disorder (ADHD) 06/25/2020   B12 deficiency    Back pain    Breast cancer screening 03/02/2014   Chronic midline low back pain without sciatica 08/02/2019   ETD (eustachian tube dysfunction) 03/02/2014   Hypertension    No known health problems    Palpitations    Pre-diabetes    Right foot pain 06/19/2012   Right groin pain 02/23/2014   Right hip pain 03/05/2014   Tired    Visit for preventive health examination 03/02/2014   Vitamin D deficiency      Past Surgical History:  Procedure Laterality Date   dilatation and curettage  2001, 2016   DILITATION & CURRETTAGE/HYSTROSCOPY WITH NOVASURE ABLATION N/A 10/19/2018   Procedure: DILATATION & CURETTAGE/HYSTEROSCOPY WITH NOVASURE ABLATION;  Surgeon: Willodean Rosenthal, MD;  Location: [facility];  Service: Gynecology;  Laterality: N/A;   LEEP  1995   WISDOM TOOTH EXTRACTION      Medications  Current Outpatient Medications on File Prior to Visit  Medication Sig Dispense Refill   buPROPion (WELLBUTRIN SR) 200 MG 12 hr tablet Take 1 tablet (200 mg total) by mouth 2 (two) times daily. 60 tablet 0   Multiple Vitamin (MULTIVITAMIN) tablet Take 1 tablet by mouth daily.     Vitamin D, Ergocalciferol, (DRISDOL) 1.25 MG (50000 UNIT) CAPS capsule Take 1 capsule (50,000 Units total) by mouth every 7 (seven)  days. 4 capsule 0    Allergies No Known Allergies  Review of Systems: Constitutional:  no unexpected weight changes Eye:  no recent significant change in vision Ear/Nose/Mouth/Throat:  Ears:  no recent change in hearing Nose/Mouth/Throat:  no complaints of nasal congestion, no sore throat Cardiovascular: no chest pain Respiratory:  no shortness of breath Gastrointestinal:  no abdominal pain, no change in bowel habits GU:  Female: negative for dysuria or pelvic pain Musculoskeletal/Extremities:  + Continuing left shoulder pain; otherwise no pain of the joints Integumentary (Skin/Breast):  no new abnormal skin lesions reported Neurologic:  no headaches Endocrine:  denies fatigue  Exam BP 106/62 (BP Location: Left Arm, Patient Position: Sitting, Cuff Size: Normal)   Pulse 74   Temp 98.4 F (36.9 C) (Oral)   Ht 5\' 3"  (1.6 m)   Wt 174 lb 8 oz (79.2 kg)   SpO2 96%   BMI 30.91 kg/m  General:  well developed, well nourished, in no apparent distress Skin:  no significant moles, warts, or growths Head:  no masses, lesions, or tenderness Eyes:  pupils equal and round, sclera anicteric without injection Ears:  canals without lesions, TMs shiny without retraction, no obvious effusion, no erythema Nose:  nares patent, mucosa normal, and no drainage  Throat/Pharynx:  lips and gingiva without lesion; tongue and uvula midline; non-inflamed pharynx; no exudates or postnasal drainage Neck: neck supple without adenopathy, thyromegaly, or masses Lungs:  clear to auscultation, breath sounds equal bilaterally, no  respiratory distress Cardio:  regular rate and rhythm, no LE edema Abdomen:  abdomen soft, nontender; bowel sounds normal; no masses or organomegaly Genital: Defer to GYN Musculoskeletal: Left shoulder: Decreased active and passive range of motion, no deformity, no bony TTP, no muscular TTP, excessive warmth, or edema; positive Neer's, Hawkins, empty can; negative crossover, liftoff,  O'Briens, speeds; symmetrical muscle groups noted without atrophy or deformity Extremities:  no clubbing, cyanosis, or edema, no deformities, no skin discoloration Neuro:  gait normal; deep tendon reflexes normal and symmetric Psych: well oriented with normal range of affect and appropriate judgment/insight  Assessment and Plan  Well adult exam - Plan: CBC, Comprehensive metabolic panel, Lipid panel  Chronic left shoulder pain - Plan: Ambulatory referral to Sports Medicine  Prediabetes - Plan: Hemoglobin A1c  Vitamin D deficiency - Plan: VITAMIN D 25 Hydroxy (Vit-D Deficiency, Fractures)   Well 52 y.o. female. Counseled on diet and exercise. Other orders as above. Advanced directive form provided today.  For the shoulder, I gave her some other stretches and exercises to address frozen shoulder/supraspinatus involvement.  Will refer to sports medicine for their opinion.  Heat, ice, Tylenol. Follow up in 6 months for medication review. The patient voiced understanding and agreement to the plan.  Jilda Roche [place], DO 02/02/23 7:59 AM

## 2023-02-09 ENCOUNTER — Other Ambulatory Visit (HOSPITAL_BASED_OUTPATIENT_CLINIC_OR_DEPARTMENT_OTHER): Payer: Self-pay | Admitting: Family Medicine

## 2023-02-09 DIAGNOSIS — Z1231 Encounter for screening mammogram for malignant neoplasm of breast: Secondary | ICD-10-CM

## 2023-02-17 ENCOUNTER — Other Ambulatory Visit: Payer: Self-pay | Admitting: Family Medicine

## 2023-02-17 DIAGNOSIS — F39 Unspecified mood [affective] disorder: Secondary | ICD-10-CM

## 2023-02-18 ENCOUNTER — Encounter (HOSPITAL_BASED_OUTPATIENT_CLINIC_OR_DEPARTMENT_OTHER): Payer: Self-pay

## 2023-02-18 ENCOUNTER — Ambulatory Visit (HOSPITAL_BASED_OUTPATIENT_CLINIC_OR_DEPARTMENT_OTHER)
Admission: RE | Admit: 2023-02-18 | Discharge: 2023-02-18 | Disposition: A | Discharge status: Home / Self Care | Payer: Managed Care, Other (non HMO) | Source: Ambulatory Visit | Attending: Family Medicine | Admitting: Family Medicine

## 2023-02-18 DIAGNOSIS — Z1231 Encounter for screening mammogram for malignant neoplasm of breast: Secondary | ICD-10-CM | POA: Insufficient documentation

## 2023-02-20 ENCOUNTER — Encounter: Payer: Self-pay | Admitting: Family Medicine

## 2023-02-20 DIAGNOSIS — F39 Unspecified mood [affective] disorder: Secondary | ICD-10-CM

## 2023-02-23 MED ORDER — BUPROPION HCL ER (SR) 200 MG PO TB12
200.0000 mg | ORAL_TABLET | Freq: Two times a day (BID) | ORAL | 0 refills | Status: DC
Start: 2023-02-23 — End: 2023-09-08

## 2023-02-25 ENCOUNTER — Ambulatory Visit (INDEPENDENT_AMBULATORY_CARE_PROVIDER_SITE_OTHER): Payer: Managed Care, Other (non HMO) | Admitting: Family Medicine

## 2023-02-25 ENCOUNTER — Encounter: Payer: Self-pay | Admitting: Family Medicine

## 2023-02-25 VITALS — BP 108/64 | Ht 63.0 in | Wt 174.0 lb

## 2023-02-25 DIAGNOSIS — M67471 Ganglion, right ankle and foot: Secondary | ICD-10-CM

## 2023-02-25 MED ORDER — MELOXICAM 15 MG PO TABS
15.0000 mg | ORAL_TABLET | Freq: Every day | ORAL | 2 refills | Status: DC
Start: 2023-02-25 — End: 2024-02-04

## 2023-02-25 NOTE — Patient Instructions (Signed)
We will refer you to Dr. Susa Simmonds for the cyst on your foot.  Your shoulder pain is due to impingement. Try to avoid painful activities (overhead activities, lifting with extended arm) as much as possible. Meloxicam 15mg  daily with food for pain and inflammation. Can take tylenol in addition to this. Subacromial injection may be beneficial to help with pain and to decrease inflammation. Consider physical therapy with transition to home exercise program. Do home exercise program with theraband and scapular stabilization exercises daily 3 sets of 10 once a day. If not improving at follow-up we will consider further imaging, injection, physical therapy, and/or nitro patches. Follow up with me in 6 weeks but call me sooner if you're struggling.

## 2023-02-26 ENCOUNTER — Encounter: Payer: Self-pay | Admitting: Family Medicine

## 2023-02-26 NOTE — Progress Notes (Signed)
PCP: Tonya Dory, DO  Subjective:   HPI: Patient is a 52 y.o. female here for left shoulder pain.  Patient reports several months of left shoulder pain. No injury or trauma. Moving arm makes this worse especially reaching behind and lifting overhead. + night pain. Tried ibuprofen, some stretches. No prior issues. Also notes cyst on right foot never improved after aspiration last year and would like to have this addressed.  Past Medical History:  Diagnosis Date   Abnormal uterine bleeding    Abnormal uterine bleeding (AUB) 10/13/2018   Attention deficit hyperactivity disorder (ADHD) 06/25/2020   B12 deficiency    Back pain    Breast cancer screening 03/02/2014   Chronic midline low back pain without sciatica 08/02/2019   ETD (eustachian tube dysfunction) 03/02/2014   Hypertension    No known health problems    Palpitations    Pre-diabetes    Right foot pain 06/19/2012   Right groin pain 02/23/2014   Right hip pain 03/05/2014   Tired    Visit for preventive health examination 03/02/2014   Vitamin D deficiency     Current Outpatient Medications on File Prior to Visit  Medication Sig Dispense Refill   buPROPion (WELLBUTRIN SR) 200 MG 12 hr tablet Take 1 tablet (200 mg total) by mouth 2 (two) times daily. 60 tablet 0   fluticasone (FLONASE) 50 MCG/ACT nasal spray Place 2 sprays into both nostrils daily. 16 g 6   Multiple Vitamin (MULTIVITAMIN) tablet Take 1 tablet by mouth daily.     Vitamin D, Ergocalciferol, (DRISDOL) 1.25 MG (50000 UNIT) CAPS capsule Take 1 capsule (50,000 Units total) by mouth every 7 (seven) days. 12 capsule 0   No current facility-administered medications on file prior to visit.    Past Surgical History:  Procedure Laterality Date   dilatation and curettage  2001, 2016   DILITATION & CURRETTAGE/HYSTROSCOPY WITH NOVASURE ABLATION N/A 10/19/2018   Procedure: DILATATION & CURETTAGE/HYSTEROSCOPY WITH NOVASURE ABLATION;  Surgeon:  Willodean Rosenthal, MD;  Location: [facility];  Service: Gynecology;  Laterality: N/A;   LEEP  1995   WISDOM TOOTH EXTRACTION      No Known Allergies  BP 108/64 (BP Location: Left Arm, Patient Position: Sitting)   Ht 5\' 3"  (1.6 m)   Wt 174 lb (78.9 kg)   BMI 30.82 kg/m       No data to display              No data to display              Objective:  Physical Exam:  Gen: NAD, comfortable in exam room  Left shoulder: No swelling, ecchymoses.  No gross deformity. No TTP. FROM with painful arc. Positive Hawkins, Neers. Negative Yergasons. Strength 5/5 with empty can and resisted internal/external rotation.  Pain empty can. NV intact distally.  Right foot: focal swelling proximal dorsolateral foot.  Limited MSK u/s of the area confirms ganglion cyst.   Assessment & Plan:  1. Left shoulder pain - 2/2 impingement.  Meloxicam daily.  Home exercise program reviewed.  Consider formal physical therapy, subacromial injection if not improving.  F/u in 6 weeks.  2. Right foot ganglion cyst - s/p aspiration without benefit - will refer to ortho to discuss surgical removal.

## 2023-04-05 ENCOUNTER — Encounter: Payer: Self-pay | Admitting: Family Medicine

## 2023-04-05 ENCOUNTER — Ambulatory Visit: Payer: Managed Care, Other (non HMO) | Admitting: Family Medicine

## 2023-04-05 VITALS — BP 136/88 | Ht 63.0 in | Wt 175.0 lb

## 2023-04-05 DIAGNOSIS — M25512 Pain in left shoulder: Secondary | ICD-10-CM | POA: Diagnosis not present

## 2023-04-05 DIAGNOSIS — G8929 Other chronic pain: Secondary | ICD-10-CM | POA: Diagnosis not present

## 2023-04-05 NOTE — Progress Notes (Signed)
PCP: Tonya Dory, DO  Subjective:   HPI: Patient is a 52 y.o. female here for L shoulder pain f/u 2/2 impingement.  At last visit, was prescribed meloxicam and encouraged to perform home exercises  Her pain is usually worse with lifting her arm or rotating it outwards  Today, pt reports minimal improvement although meloxicam has been helping a little  Patient states today that she has is interested in physical therapy  Denies any recent injury, swelling to the area  Did not end up seeing ortho for her R foot ganglion cyst as it improved on its own, has not had additional flares. Pt states she will reach out if she has further concerns    Past Medical History:  Diagnosis Date   Abnormal uterine bleeding    Abnormal uterine bleeding (AUB) 10/13/2018   Attention deficit hyperactivity disorder (ADHD) 06/25/2020   B12 deficiency    Back pain    Breast cancer screening 03/02/2014   Chronic midline low back pain without sciatica 08/02/2019   ETD (eustachian tube dysfunction) 03/02/2014   Hypertension    No known health problems    Palpitations    Pre-diabetes    Right foot pain 06/19/2012   Right groin pain 02/23/2014   Right hip pain 03/05/2014   Tired    Visit for preventive health examination 03/02/2014   Vitamin D deficiency     Current Outpatient Medications on File Prior to Visit  Medication Sig Dispense Refill   buPROPion (WELLBUTRIN SR) 200 MG 12 hr tablet Take 1 tablet (200 mg total) by mouth 2 (two) times daily. 60 tablet 0   fluticasone (FLONASE) 50 MCG/ACT nasal spray Place 2 sprays into both nostrils daily. 16 g 6   meloxicam (MOBIC) 15 MG tablet Take 1 tablet (15 mg total) by mouth daily. 30 tablet 2   Multiple Vitamin (MULTIVITAMIN) tablet Take 1 tablet by mouth daily.     Vitamin D, Ergocalciferol, (DRISDOL) 1.25 MG (50000 UNIT) CAPS capsule Take 1 capsule (50,000 Units total) by mouth every 7 (seven) days. 12 capsule 0   No current  facility-administered medications on file prior to visit.    Past Surgical History:  Procedure Laterality Date   dilatation and curettage  2001, 2016   DILITATION & CURRETTAGE/HYSTROSCOPY WITH NOVASURE ABLATION N/A 10/19/2018   Procedure: DILATATION & CURETTAGE/HYSTEROSCOPY WITH NOVASURE ABLATION;  Surgeon: Willodean Rosenthal, MD;  Location: [facility];  Service: Gynecology;  Laterality: N/A;   LEEP  1995   WISDOM TOOTH EXTRACTION      No Known Allergies  BP 136/88   Ht 5\' 3"  (1.6 m)   Wt 175 lb (79.4 kg)   BMI 31.00 kg/m       No data to display              No data to display              Objective:  Physical Exam:  Gen: NAD, comfortable in exam room  L shoulder: No gross deformity, ecchymoses, swelling Nontender to palpation along clavicle, AC joint, scapula +Neers, +pain with empty can Pain/stiffness with active external rotation to 50 degrees compared to 80 on right. Neg hawkins, neg yergason  5/5 strength with resisted internal/external rotation and empty can Sensation and perfusion intact distally   Assessment & Plan:  1.  Left shoulder pain - 2/2 impingement +/- adhesive capsulitis. Continue conservative management and meloxicam. Refer to PT. F/u 1mo.

## 2023-04-05 NOTE — Patient Instructions (Addendum)
Start physical therapy and do home exercises on days you don't go to therapy. Consider injection if pain worsens (this is unlikely). Call if the cyst in your foot flares up and you want to see Dr. Susa Simmonds. Follow up with me in 5-6 weeks.

## 2023-04-27 ENCOUNTER — Other Ambulatory Visit: Payer: Self-pay

## 2023-04-27 ENCOUNTER — Encounter: Payer: Self-pay | Admitting: Physical Therapy

## 2023-04-27 ENCOUNTER — Ambulatory Visit: Payer: Managed Care, Other (non HMO) | Attending: Family Medicine | Admitting: Physical Therapy

## 2023-04-27 DIAGNOSIS — R29898 Other symptoms and signs involving the musculoskeletal system: Secondary | ICD-10-CM | POA: Diagnosis present

## 2023-04-27 DIAGNOSIS — M25612 Stiffness of left shoulder, not elsewhere classified: Secondary | ICD-10-CM | POA: Insufficient documentation

## 2023-04-27 DIAGNOSIS — M6281 Muscle weakness (generalized): Secondary | ICD-10-CM | POA: Insufficient documentation

## 2023-04-27 DIAGNOSIS — R293 Abnormal posture: Secondary | ICD-10-CM | POA: Insufficient documentation

## 2023-04-27 DIAGNOSIS — M25512 Pain in left shoulder: Secondary | ICD-10-CM | POA: Insufficient documentation

## 2023-04-27 DIAGNOSIS — G8929 Other chronic pain: Secondary | ICD-10-CM | POA: Insufficient documentation

## 2023-04-27 NOTE — Therapy (Signed)
OUTPATIENT PHYSICAL THERAPY SHOULDER EVALUATION   Patient Name: Tonya Duran MRN: [id number] DOB:1971/08/12, 52 y.o., female Today's Date: 04/27/2023  END OF SESSION:  PT End of Session - 04/27/23 0901     Visit Number 1    Number of Visits 13    Date for PT Re-Evaluation 06/08/23    Authorization Type [employer]    Authorization Time Period 04/27/23 to 06/08/23    Authorization - Number of Visits 50    PT Start Time 0846    PT Stop Time 0922   all goals of eval complete   PT Time Calculation (min) 36 min    Activity Tolerance Patient tolerated treatment well    Behavior During Therapy [facility] for tasks assessed/performed             Past Medical History:  Diagnosis Date   Abnormal uterine bleeding    Abnormal uterine bleeding (AUB) 10/13/2018   Attention deficit hyperactivity disorder (ADHD) 06/25/2020   B12 deficiency    Back pain    Breast cancer screening 03/02/2014   Chronic midline low back pain without sciatica 08/02/2019   ETD (eustachian tube dysfunction) 03/02/2014   Hypertension    No known health problems    Palpitations    Pre-diabetes    Right foot pain 06/19/2012   Right groin pain 02/23/2014   Right hip pain 03/05/2014   Tired    Visit for preventive health examination 03/02/2014   Vitamin D deficiency    Past Surgical History:  Procedure Laterality Date   dilatation and curettage  2001, 2016   DILITATION & CURRETTAGE/HYSTROSCOPY WITH NOVASURE ABLATION N/A 10/19/2018   Procedure: DILATATION & CURETTAGE/HYSTEROSCOPY WITH NOVASURE ABLATION;  Surgeon: Willodean Rosenthal, MD;  Location: [facility];  Service: Gynecology;  Laterality: N/A;   LEEP  1995   WISDOM TOOTH EXTRACTION     Patient Active Problem List   Diagnosis Date Noted   BMI 29.0-29.9,adult 10/15/2022   Obesity, Beginning BMI 31.76 10/15/2022   Mood disorder, emotional eating behavior 08/04/2022   SOBOE (shortness of breath on exertion) 06/10/2022   Other fatigue 06/10/2022    Depression screening 06/10/2022   B12 deficiency 06/10/2022   Vitamin D deficiency 06/10/2022   Prediabetes 06/02/2022   Prehypertension 06/02/2022   Polyphagia 06/02/2022   Class 1 obesity with serious comorbidity and body mass index (BMI) of 31.0 to 31.9 in adult 03/03/2022   Ganglion cyst of right foot 10/06/2021   No known health problems    Attention deficit hyperactivity disorder (ADHD) 06/25/2020   Chronic midline low back pain without sciatica 08/02/2019   Abnormal uterine bleeding (AUB) 10/13/2018   Breast cancer screening 03/02/2014   Visit for preventive health examination 03/02/2014   ETD (eustachian tube dysfunction) 03/02/2014    PCP: Sharlene Dory DO   REFERRING PROVIDER: Lenda Kelp, MD  REFERRING DIAG: [phone] (ICD-10-CM) - Chronic left shoulder pain  THERAPY DIAG:  Chronic left shoulder pain  Stiffness of left shoulder, not elsewhere classified  Muscle weakness (generalized)  Abnormal posture  Other symptoms and signs involving the musculoskeletal system  Rationale for Evaluation and Treatment: Rehabilitation  ONSET DATE: about 6 months ago  SUBJECTIVE:  SUBJECTIVE STATEMENT:  I'm not sure what happened but if I reached behind me it would hurt a lot, its gotten better but its still bothering me. I don't have my normal ROM, reaching overhead is hard, I used to work out but that's on the back burner now until my arm is better. Getting dressed can be challenging due to having to reach behind. I know I'm super tight and I feel like I have a tight band going down that side. No neck issues, no N/T, no issues with grip strength/fine motor.   Hand dominance: Right  PERTINENT HISTORY: ADHD, back pain, HTN, pre-DM  PAIN:  Are you having pain? No 0/10 at rest,  can get to 7-8/10 with quick movements (sharp excruciating at first then fades to dull ache)  PRECAUTIONS: None  RED FLAGS: None   WEIGHT BEARING RESTRICTIONS: No  FALLS:  Has patient fallen in last 6 months? No  LIVING ENVIRONMENT: Lives with: lives with their family Lives in: House/apartment   OCCUPATION: Social Investment banker, operational   PLOF: Independent, Independent with basic ADLs, Independent with gait, and Independent with transfers  PATIENT GOALS:get shoulder feeling better, get back to working out, take care of spasms in back   NEXT MD VISIT:   OBJECTIVE:     PATIENT SURVEYS:  Quick Dash 20.5/100  COGNITION: Overall cognitive status: Within functional limits for tasks assessed     SENSATION: Not tested  POSTURE:  Forward head, rounded shoulders, increased thoracic kyphosis   UPPER EXTREMITY ROM:   Active ROM Right eval Left eval  Shoulder flexion 154* 142*  Shoulder extension    Shoulder abduction 162* 88*  Shoulder adduction    Shoulder internal rotation T7 Mid L glute   Shoulder external rotation T3 T3  Elbow flexion    Elbow extension    Wrist flexion    Wrist extension    Wrist ulnar deviation    Wrist radial deviation    Wrist pronation    Wrist supination    (Blank rows = not tested)  UPPER EXTREMITY MMT:  MMT Right eval Left eval  Shoulder flexion 4+ 4+  Shoulder extension 4- 4-  Shoulder abduction 4+ 4+  Shoulder adduction    Shoulder internal rotation 4+ 4+  Shoulder external rotation 4+ 4+  Middle trapezius 3- 3-  Lower trapezius 3- 3-  Elbow flexion    Elbow extension    Wrist flexion    Wrist extension    Wrist ulnar deviation    Wrist radial deviation    Wrist pronation    Wrist supination    Grip strength (lbs)    (Blank rows = not tested)  SHOULDER SPECIAL TESTS:  Rotator cuff assessment: Empty can test: negative     PALPATION:  Severe mm spasms and trigger points noted R thoracic paraspinals, levator,  rhomboids, upper trap, some on the left but not nearly as bad    TODAY'S TREATMENT:  DATE:   Eval  Objective findings, care planning, appropriate education  TherEx  UBE L2 x3 min forward/3 min backwards Scap retraction red TB x10 with 2 second holds Shoulder extensions red TB x10 with 2 second holds Thoracic extensions x10 with 3 second holds Attempted corner stretch, unable to tolerate due to L shoulder pain Backwards shoulder rolls x20    PATIENT EDUCATION: Education details: exam findings, POC, HEP  Person educated: Patient Education method: Programmer, multimedia, Demonstration, and Handouts Education comprehension: verbalized understanding, returned demonstration, and needs further education  HOME EXERCISE PROGRAM: Access Code: [email or web address] URL: [email or web address] Date: 04/27/2023 Prepared by: Nedra Hai  Exercises - Scapular Retraction with Resistance  - 2 x daily - 7 x weekly - 1 sets - 10 reps - 2 seconds  hold - Shoulder extension with resistance - Neutral  - 2 x daily - 7 x weekly - 1 sets - 10 reps - 2 seconds hold - Seated Thoracic Extension with Hands Behind Neck  - 2 x daily - 7 x weekly - 1 sets - 10 reps - 3 seconds  hold - Standing Backward Shoulder Rolls  - 2 x daily - 7 x weekly - 1 sets - 20 reps - Corner Pec Minor Stretch  - 2 x daily - 7 x weekly - 1 sets - 3 reps - 10 seconds  hold  ASSESSMENT:  CLINICAL IMPRESSION: Patient is a 52 y.o. F who was seen today for physical therapy evaluation and treatment for L shoulder pain. Exam with objective findings as above. Will greatly benefit from skilled PT services to address all impairments, reduce pain, and assist in return to regular gym activities.    OBJECTIVE IMPAIRMENTS: decreased ROM, decreased strength, hypomobility, increased fascial restrictions, increased  muscle spasms, impaired flexibility, impaired UE functional use, postural dysfunction, and pain.   ACTIVITY LIMITATIONS: carrying, lifting, sleeping, dressing, reach over head, and caring for others  PARTICIPATION LIMITATIONS: meal prep, cleaning, laundry, driving, shopping, community activity, and occupation  PERSONAL FACTORS: Age, Behavior pattern, Fitness, Past/current experiences, and Time since onset of injury/illness/exacerbation are also affecting patient's functional outcome.   REHAB POTENTIAL: Good  CLINICAL DECISION MAKING: Stable/uncomplicated  EVALUATION COMPLEXITY: Low   GOALS: Goals reviewed with patient? Yes  SHORT TERM GOALS: Target date: 05/11/2023    Will be compliant with appropriate progressive HEP  Baseline: Goal status: INITIAL    LONG TERM GOALS: Target date: 06/08/2023    L shoulder AROM to be equal to that of the R  Baseline:  Goal status: INITIAL  2.  MMT to improve by at least 1 grade all weak groups  Baseline:  Goal status: INITIAL  3.  Will demonstrate better awareness of functional ergonomics and posture with all functional tasks  Baseline:  Goal status: INITIAL  4.  Will be able to hook bra stap and reach overhead without increase in pain L shoulder  Baseline:  Goal status: INITIAL  5.  Will have been able to return to regular gym activity without increase in pain L shoulder  Baseline:  Goal status: INITIAL  6.  L shoulder pain to be no more than 3/10 at worst with all funtional activities.  Baseline:  Goal status: INITIAL    PLAN:  PT FREQUENCY: 1-2x/week  PT DURATION: 6 weeks  PLANNED INTERVENTIONS: Therapeutic exercises, Therapeutic activity, Patient/Family education, Self Care, Joint mobilization, Aquatic Therapy, Dry Needling, Electrical stimulation, Spinal mobilization, Cryotherapy, Moist heat, Taping, Ultrasound, Ionotophoresis 4mg /ml Dexamethasone, Manual therapy, and Re-evaluation  PLAN FOR NEXT  SESSION: needs  weekly HEP updates (only coming 1x/week due to work schedule and co-pay), postural strengthening, shoulder ROM/shoulder strength, dry needling B shoulders (she is agreeable)  Nedra Hai, PT, DPT 04/27/23 9:26 AM

## 2023-05-04 ENCOUNTER — Ambulatory Visit: Payer: Managed Care, Other (non HMO) | Attending: Family Medicine

## 2023-05-04 DIAGNOSIS — R293 Abnormal posture: Secondary | ICD-10-CM

## 2023-05-04 DIAGNOSIS — G8929 Other chronic pain: Secondary | ICD-10-CM

## 2023-05-04 DIAGNOSIS — M6281 Muscle weakness (generalized): Secondary | ICD-10-CM | POA: Diagnosis present

## 2023-05-04 DIAGNOSIS — M25612 Stiffness of left shoulder, not elsewhere classified: Secondary | ICD-10-CM | POA: Diagnosis present

## 2023-05-04 DIAGNOSIS — R29898 Other symptoms and signs involving the musculoskeletal system: Secondary | ICD-10-CM

## 2023-05-04 DIAGNOSIS — M25512 Pain in left shoulder: Secondary | ICD-10-CM | POA: Diagnosis not present

## 2023-05-04 NOTE — Therapy (Signed)
OUTPATIENT PHYSICAL THERAPY SHOULDER TREATMENT   Patient Name: Capria Sainthilaire MRN: [id number] DOB:1970-12-04, 52 y.o., female Today's Date: 05/04/2023  END OF SESSION:  PT End of Session - 05/04/23 0808     Visit Number 2    Number of Visits 13    Date for PT Re-Evaluation 06/08/23    Authorization Type [employer]    Authorization Time Period 04/27/23 to 06/08/23    Authorization - Number of Visits 50    PT Start Time 0801    PT Stop Time 0843    PT Time Calculation (min) 42 min    Activity Tolerance Patient tolerated treatment well    Behavior During Therapy [facility] for tasks assessed/performed              Past Medical History:  Diagnosis Date   Abnormal uterine bleeding    Abnormal uterine bleeding (AUB) 10/13/2018   Attention deficit hyperactivity disorder (ADHD) 06/25/2020   B12 deficiency    Back pain    Breast cancer screening 03/02/2014   Chronic midline low back pain without sciatica 08/02/2019   ETD (eustachian tube dysfunction) 03/02/2014   Hypertension    No known health problems    Palpitations    Pre-diabetes    Right foot pain 06/19/2012   Right groin pain 02/23/2014   Right hip pain 03/05/2014   Tired    Visit for preventive health examination 03/02/2014   Vitamin D deficiency    Past Surgical History:  Procedure Laterality Date   dilatation and curettage  2001, 2016   DILITATION & CURRETTAGE/HYSTROSCOPY WITH NOVASURE ABLATION N/A 10/19/2018   Procedure: DILATATION & CURETTAGE/HYSTEROSCOPY WITH NOVASURE ABLATION;  Surgeon: Willodean Rosenthal, MD;  Location: [facility];  Service: Gynecology;  Laterality: N/A;   LEEP  1995   WISDOM TOOTH EXTRACTION     Patient Active Problem List   Diagnosis Date Noted   BMI 29.0-29.9,adult 10/15/2022   Obesity, Beginning BMI 31.76 10/15/2022   Mood disorder, emotional eating behavior 08/04/2022   SOBOE (shortness of breath on exertion) 06/10/2022   Other fatigue 06/10/2022   Depression screening 06/10/2022    B12 deficiency 06/10/2022   Vitamin D deficiency 06/10/2022   Prediabetes 06/02/2022   Prehypertension 06/02/2022   Polyphagia 06/02/2022   Class 1 obesity with serious comorbidity and body mass index (BMI) of 31.0 to 31.9 in adult 03/03/2022   Ganglion cyst of right foot 10/06/2021   No known health problems    Attention deficit hyperactivity disorder (ADHD) 06/25/2020   Chronic midline low back pain without sciatica 08/02/2019   Abnormal uterine bleeding (AUB) 10/13/2018   Breast cancer screening 03/02/2014   Visit for preventive health examination 03/02/2014   ETD (eustachian tube dysfunction) 03/02/2014    PCP: Sharlene Dory DO   REFERRING PROVIDER: Lenda Kelp, MD  REFERRING DIAG: [phone] (ICD-10-CM) - Chronic left shoulder pain  THERAPY DIAG:  Chronic left shoulder pain  Stiffness of left shoulder, not elsewhere classified  Muscle weakness (generalized)  Abnormal posture  Other symptoms and signs involving the musculoskeletal system  Rationale for Evaluation and Treatment: Rehabilitation  ONSET DATE: about 6 months ago  SUBJECTIVE:  SUBJECTIVE STATEMENT:  Pt reports pain in L shoulder with reaching up and behind her. Only reports stiffness in R shoulder.  Hand dominance: Right  PERTINENT HISTORY: ADHD, back pain, HTN, pre-DM  PAIN:  Are you having pain? No 0/10 at rest, can get to 6-7/10 with quick movements (sharp excruciating at first then fades to dull ache)  PRECAUTIONS: None  RED FLAGS: None   WEIGHT BEARING RESTRICTIONS: No  FALLS:  Has patient fallen in last 6 months? No  LIVING ENVIRONMENT: Lives with: lives with their family Lives in: House/apartment   OCCUPATION: Social Investment banker, operational   PLOF: Independent, Independent with basic  ADLs, Independent with gait, and Independent with transfers  PATIENT GOALS:get shoulder feeling better, get back to working out, take care of spasms in back   NEXT MD VISIT:   OBJECTIVE:     PATIENT SURVEYS:  Quick Dash 20.5/100  COGNITION: Overall cognitive status: Within functional limits for tasks assessed     SENSATION: Not tested  POSTURE:  Forward head, rounded shoulders, increased thoracic kyphosis   UPPER EXTREMITY ROM:   Active ROM Right eval Left eval  Shoulder flexion 154* 142*  Shoulder extension    Shoulder abduction 162* 88*  Shoulder adduction    Shoulder internal rotation T7 Mid L glute   Shoulder external rotation T3 T3  Elbow flexion    Elbow extension    Wrist flexion    Wrist extension    Wrist ulnar deviation    Wrist radial deviation    Wrist pronation    Wrist supination    (Blank rows = not tested)  UPPER EXTREMITY MMT:  MMT Right eval Left eval  Shoulder flexion 4+ 4+  Shoulder extension 4- 4-  Shoulder abduction 4+ 4+  Shoulder adduction    Shoulder internal rotation 4+ 4+  Shoulder external rotation 4+ 4+  Middle trapezius 3- 3-  Lower trapezius 3- 3-  Elbow flexion    Elbow extension    Wrist flexion    Wrist extension    Wrist ulnar deviation    Wrist radial deviation    Wrist pronation    Wrist supination    Grip strength (lbs)    (Blank rows = not tested)  SHOULDER SPECIAL TESTS:  Rotator cuff assessment: Empty can test: negative     PALPATION:  Severe mm spasms and trigger points noted R thoracic paraspinals, levator, rhomboids, upper trap, some on the left but not nearly as bad    TODAY'S TREATMENT:                                                                                                                                         DATE:  05/04/23 Therapeutic Exercise: to improve strength and mobility.  Demo, verbal and tactile cues throughout for technique.  UBE L1.5 3 min fwd and 3 min back Scap  retraction and row GTB  x 10  Shld ext with scap retraction GTB x 10 Tried doorway pec stretch but painful Supine AAROM shoulder flexion with wand x 10  Supine chest press AAROM x 10  Supine L shoulder AAROM abduction with wand x 10   Manual Therapy: to decrease muscle spasm, pain and improve mobility STM to bil UT,LS R rhomboids, L anterior deltoids PROM to L shoulder while guiding scapula  Eval  Objective findings, care planning, appropriate education  TherEx  UBE L2 x3 min forward/3 min backwards Scap retraction red TB x10 with 2 second holds Shoulder extensions red TB x10 with 2 second holds Thoracic extensions x10 with 3 second holds Attempted corner stretch, unable to tolerate due to L shoulder pain Backwards shoulder rolls x20    PATIENT EDUCATION: Education details: exam findings, POC, HEP  Person educated: Patient Education method: Programmer, multimedia, Demonstration, and Handouts Education comprehension: verbalized understanding, returned demonstration, and needs further education  HOME EXERCISE PROGRAM: Access Code: [email or web address] URL: [email or web address] Date: 05/04/2023 Prepared by: Verta Ellen  Exercises - Scapular Retraction with Resistance  - 2 x daily - 7 x weekly - 1 sets - 10 reps - 2 seconds  hold - Shoulder extension with resistance - Neutral  - 2 x daily - 7 x weekly - 1 sets - 10 reps - 2 seconds hold - Seated Thoracic Extension with Hands Behind Neck  - 2 x daily - 7 x weekly - 1 sets - 10 reps - 3 seconds  hold - Standing Backward Shoulder Rolls  - 2 x daily - 7 x weekly - 1 sets - 20 reps - Corner Pec Minor Stretch  - 2 x daily - 7 x weekly - 1 sets - 3 reps - 10 seconds  hold - Supine Shoulder Flexion Extension AAROM with Dowel  - 1 x daily - 7 x weekly - 2 sets - 10 reps - Supine Shoulder Abduction AAROM with Dowel  - 1 x daily - 7 x weekly - 2 sets - 10 reps  ASSESSMENT:  CLINICAL IMPRESSION: Patient is a 52 y.o. F who was seen today  for physical therapy treatment for L shoulder pain. Advanced AAROM exercises for L shoulder with good tolerance but cues provided to avoid painful ROM. Progressed scapular exercises to GTB for HEP. She had a lot of tension in both UT,LS, and R rhomboids, would suggest some DN in future. Pt responded well to treatment.   OBJECTIVE IMPAIRMENTS: decreased ROM, decreased strength, hypomobility, increased fascial restrictions, increased muscle spasms, impaired flexibility, impaired UE functional use, postural dysfunction, and pain.   ACTIVITY LIMITATIONS: carrying, lifting, sleeping, dressing, reach over head, and caring for others  PARTICIPATION LIMITATIONS: meal prep, cleaning, laundry, driving, shopping, community activity, and occupation  PERSONAL FACTORS: Age, Behavior pattern, Fitness, Past/current experiences, and Time since onset of injury/illness/exacerbation are also affecting patient's functional outcome.   REHAB POTENTIAL: Good  CLINICAL DECISION MAKING: Stable/uncomplicated  EVALUATION COMPLEXITY: Low   GOALS: Goals reviewed with patient? Yes  SHORT TERM GOALS: Target date: 05/11/2023    Will be compliant with appropriate progressive HEP  Baseline: Goal status: INITIAL    LONG TERM GOALS: Target date: 06/08/2023    L shoulder AROM to be equal to that of the R  Baseline:  Goal status: INITIAL  2.  MMT to improve by at least 1 grade all weak groups  Baseline:  Goal status: INITIAL  3.  Will demonstrate better awareness of functional ergonomics and posture with all functional tasks  Baseline:  Goal status: INITIAL  4.  Will be able to hook bra stap and reach overhead without increase in pain L shoulder  Baseline:  Goal status: INITIAL  5.  Will have been able to return to regular gym activity without increase in pain L shoulder  Baseline:  Goal status: INITIAL  6.  L shoulder pain to be no more than 3/10 at worst with all funtional activities.  Baseline:  Goal  status: INITIAL    PLAN:  PT FREQUENCY: 1-2x/week  PT DURATION: 6 weeks  PLANNED INTERVENTIONS: Therapeutic exercises, Therapeutic activity, Patient/Family education, Self Care, Joint mobilization, Aquatic Therapy, Dry Needling, Electrical stimulation, Spinal mobilization, Cryotherapy, Moist heat, Taping, Ultrasound, Ionotophoresis 4mg /ml Dexamethasone, Manual therapy, and Re-evaluation  PLAN FOR NEXT SESSION: needs weekly HEP updates (only coming 1x/week due to work schedule and co-pay), postural strengthening, shoulder ROM/shoulder strength, dry needling B shoulders (she is agreeable)  Darleene Cleaver, PTA  05/04/23 8:46 AM

## 2023-05-10 ENCOUNTER — Ambulatory Visit: Payer: Managed Care, Other (non HMO)

## 2023-05-10 DIAGNOSIS — M25612 Stiffness of left shoulder, not elsewhere classified: Secondary | ICD-10-CM

## 2023-05-10 DIAGNOSIS — M6281 Muscle weakness (generalized): Secondary | ICD-10-CM

## 2023-05-10 DIAGNOSIS — G8929 Other chronic pain: Secondary | ICD-10-CM

## 2023-05-10 DIAGNOSIS — R293 Abnormal posture: Secondary | ICD-10-CM

## 2023-05-10 DIAGNOSIS — M25512 Pain in left shoulder: Secondary | ICD-10-CM | POA: Diagnosis not present

## 2023-05-10 DIAGNOSIS — R29898 Other symptoms and signs involving the musculoskeletal system: Secondary | ICD-10-CM

## 2023-05-10 NOTE — Therapy (Addendum)
OUTPATIENT PHYSICAL THERAPY SHOULDER TREATMENT/Discharge Summary   Patient Name: Tonya Duran MRN: [id number] DOB:Jan 09, 1971, 52 y.o., female Today's Date: 05/10/2023  END OF SESSION:  PT End of Session - 05/10/23 1618     Visit Number 3    Number of Visits 13    Date for PT Re-Evaluation 06/08/23    Authorization Type [employer]    Authorization Time Period 04/27/23 to 06/08/23    Authorization - Number of Visits 50    PT Start Time 1532    PT Stop Time 1615    PT Time Calculation (min) 43 min    Activity Tolerance Patient tolerated treatment well    Behavior During Therapy [facility] for tasks assessed/performed               Past Medical History:  Diagnosis Date   Abnormal uterine bleeding    Abnormal uterine bleeding (AUB) 10/13/2018   Attention deficit hyperactivity disorder (ADHD) 06/25/2020   B12 deficiency    Back pain    Breast cancer screening 03/02/2014   Chronic midline low back pain without sciatica 08/02/2019   ETD (eustachian tube dysfunction) 03/02/2014   Hypertension    No known health problems    Palpitations    Pre-diabetes    Right foot pain 06/19/2012   Right groin pain 02/23/2014   Right hip pain 03/05/2014   Tired    Visit for preventive health examination 03/02/2014   Vitamin D deficiency    Past Surgical History:  Procedure Laterality Date   dilatation and curettage  2001, 2016   DILITATION & CURRETTAGE/HYSTROSCOPY WITH NOVASURE ABLATION N/A 10/19/2018   Procedure: DILATATION & CURETTAGE/HYSTEROSCOPY WITH NOVASURE ABLATION;  Surgeon: Willodean Rosenthal, MD;  Location: [facility];  Service: Gynecology;  Laterality: N/A;   LEEP  1995   WISDOM TOOTH EXTRACTION     Patient Active Problem List   Diagnosis Date Noted   BMI 29.0-29.9,adult 10/15/2022   Obesity, Beginning BMI 31.76 10/15/2022   Mood disorder, emotional eating behavior 08/04/2022   SOBOE (shortness of breath on exertion) 06/10/2022   Other fatigue 06/10/2022   Depression  screening 06/10/2022   B12 deficiency 06/10/2022   Vitamin D deficiency 06/10/2022   Prediabetes 06/02/2022   Prehypertension 06/02/2022   Polyphagia 06/02/2022   Class 1 obesity with serious comorbidity and body mass index (BMI) of 31.0 to 31.9 in adult 03/03/2022   Ganglion cyst of right foot 10/06/2021   No known health problems    Attention deficit hyperactivity disorder (ADHD) 06/25/2020   Chronic midline low back pain without sciatica 08/02/2019   Abnormal uterine bleeding (AUB) 10/13/2018   Breast cancer screening 03/02/2014   Visit for preventive health examination 03/02/2014   ETD (eustachian tube dysfunction) 03/02/2014    PCP: Sharlene Dory DO   REFERRING PROVIDER: Lenda Kelp, MD  REFERRING DIAG: [phone] (ICD-10-CM) - Chronic left shoulder pain  THERAPY DIAG:  Chronic left shoulder pain  Stiffness of left shoulder, not elsewhere classified  Muscle weakness (generalized)  Abnormal posture  Other symptoms and signs involving the musculoskeletal system  Rationale for Evaluation and Treatment: Rehabilitation  ONSET DATE: about 6 months ago  SUBJECTIVE:  SUBJECTIVE STATEMENT: Pt reports improved pain today.   Hand dominance: Right  PERTINENT HISTORY: ADHD, back pain, HTN, pre-DM  PAIN:  Are you having pain? No 0/10 at rest, can get to 4-5/10 with quick movements (sharp excruciating at first then fades to dull ache)  PRECAUTIONS: None  RED FLAGS: None   WEIGHT BEARING RESTRICTIONS: No  FALLS:  Has patient fallen in last 6 months? No  LIVING ENVIRONMENT: Lives with: lives with their family Lives in: House/apartment   OCCUPATION: Social Investment banker, operational   PLOF: Independent, Independent with basic ADLs, Independent with gait, and Independent  with transfers  PATIENT GOALS:get shoulder feeling better, get back to working out, take care of spasms in back   NEXT MD VISIT:   OBJECTIVE:     PATIENT SURVEYS:  Quick Dash 20.5/100  COGNITION: Overall cognitive status: Within functional limits for tasks assessed     SENSATION: Not tested  POSTURE:  Forward head, rounded shoulders, increased thoracic kyphosis   UPPER EXTREMITY ROM:   Active ROM Right eval Left eval  Shoulder flexion 154* 142*  Shoulder extension    Shoulder abduction 162* 88*  Shoulder adduction    Shoulder internal rotation T7 Mid L glute   Shoulder external rotation T3 T3  Elbow flexion    Elbow extension    Wrist flexion    Wrist extension    Wrist ulnar deviation    Wrist radial deviation    Wrist pronation    Wrist supination    (Blank rows = not tested)  UPPER EXTREMITY MMT:  MMT Right eval Left eval  Shoulder flexion 4+ 4+  Shoulder extension 4- 4-  Shoulder abduction 4+ 4+  Shoulder adduction    Shoulder internal rotation 4+ 4+  Shoulder external rotation 4+ 4+  Middle trapezius 3- 3-  Lower trapezius 3- 3-  Elbow flexion    Elbow extension    Wrist flexion    Wrist extension    Wrist ulnar deviation    Wrist radial deviation    Wrist pronation    Wrist supination    Grip strength (lbs)    (Blank rows = not tested)  SHOULDER SPECIAL TESTS:  Rotator cuff assessment: Empty can test: negative     PALPATION:  Severe mm spasms and trigger points noted R thoracic paraspinals, levator, rhomboids, upper trap, some on the left but not nearly as bad    TODAY'S TREATMENT:                                                                                                                                         DATE:  05/10/23 Therapeutic Exercise: to improve strength and mobility.  Demo, verbal and tactile cues throughout for technique.  Pulleys x 3 min flexion and 3 min scaption Side-lying Bil shoulder: Abduction x 10  ER  x 10  Flexion x 10 Horizontal abduction x 10  Supine CW/CCW circles 2x10 bil UE Wall slides flexion and scaption x 10 each movement and each UE with GTB over shoulder to keep scapular depression  05/04/23 Therapeutic Exercise: to improve strength and mobility.  Demo, verbal and tactile cues throughout for technique.  UBE L1.5 3 min fwd and 3 min back Scap retraction and row GTB x 10  Shld ext with scap retraction GTB x 10 Tried doorway pec stretch but painful Supine AAROM shoulder flexion with wand x 10  Supine chest press AAROM x 10  Supine L shoulder AAROM abduction with wand x 10   Manual Therapy: to decrease muscle spasm, pain and improve mobility STM to bil UT,LS R rhomboids, L anterior deltoids PROM to L shoulder while guiding scapula  Eval  Objective findings, care planning, appropriate education  TherEx  UBE L2 x3 min forward/3 min backwards Scap retraction red TB x10 with 2 second holds Shoulder extensions red TB x10 with 2 second holds Thoracic extensions x10 with 3 second holds Attempted corner stretch, unable to tolerate due to L shoulder pain Backwards shoulder rolls x20    PATIENT EDUCATION: Education details: exam findings, POC, HEP  Person educated: Patient Education method: Programmer, multimedia, Demonstration, and Handouts Education comprehension: verbalized understanding, returned demonstration, and needs further education  HOME EXERCISE PROGRAM: Access Code: [email or web address] URL: [email or web address] Date: 05/10/2023 Prepared by: Verta Ellen  Exercises - Scapular Retraction with Resistance  - 2 x daily - 7 x weekly - 1 sets - 10 reps - 2 seconds  hold - Shoulder extension with resistance - Neutral  - 2 x daily - 7 x weekly - 1 sets - 10 reps - 2 seconds hold - Seated Thoracic Extension with Hands Behind Neck  - 2 x daily - 7 x weekly - 1 sets - 10 reps - 3 seconds  hold - Corner Pec Minor Stretch  - 2 x daily - 7 x weekly - 1 sets - 3 reps - 10  seconds  hold - Sidelying Shoulder External Rotation  - 1 x daily - 7 x weekly - 2 sets - 10 reps - Sidelying Shoulder Flexion 15 Degrees  - 1 x daily - 7 x weekly - 2 sets - 10 reps - Sidelying Shoulder Horizontal Abduction  - 1 x daily - 7 x weekly - 2 sets - 10 reps - Shoulder Flexion Wall Slide with Towel  - 1 x daily - 7 x weekly - 2 sets - 10 reps - Scaption Wall Slide with Towel  - 1 x daily - 7 x weekly - 2 sets - 10 reps  ASSESSMENT:  CLINICAL IMPRESSION: Patient is a 52 y.o. F who was seen today for physical therapy treatment for L shoulder pain. She shows good PROM in both shoulders but still pain with OH reaching which may indicate some impingement. Worked on exercises to strengthen both shoulders while also emphasizing scapular depression. Added GTB over shoulder with wall slides to avoid shrug.   OBJECTIVE IMPAIRMENTS: decreased ROM, decreased strength, hypomobility, increased fascial restrictions, increased muscle spasms, impaired flexibility, impaired UE functional use, postural dysfunction, and pain.   ACTIVITY LIMITATIONS: carrying, lifting, sleeping, dressing, reach over head, and caring for others  PARTICIPATION LIMITATIONS: meal prep, cleaning, laundry, driving, shopping, community activity, and occupation  PERSONAL FACTORS: Age, Behavior pattern, Fitness, Past/current experiences, and Time since onset of injury/illness/exacerbation are also affecting patient's functional outcome.   REHAB POTENTIAL: Good  CLINICAL DECISION MAKING: Stable/uncomplicated  EVALUATION COMPLEXITY: Low   GOALS: Goals  reviewed with patient? Yes  SHORT TERM GOALS: Target date: 05/11/2023    Will be compliant with appropriate progressive HEP  Baseline: Goal status: MET- 05/10/23     LONG TERM GOALS: Target date: 06/08/2023    L shoulder AROM to be equal to that of the R  Baseline:  Goal status: INITIAL  2.  MMT to improve by at least 1 grade all weak groups  Baseline:  Goal  status: INITIAL  3.  Will demonstrate better awareness of functional ergonomics and posture with all functional tasks  Baseline:  Goal status: INITIAL  4.  Will be able to hook bra stap and reach overhead without increase in pain L shoulder  Baseline:  Goal status: INITIAL  5.  Will have been able to return to regular gym activity without increase in pain L shoulder  Baseline:  Goal status: INITIAL  6.  L shoulder pain to be no more than 3/10 at worst with all funtional activities.  Baseline:  Goal status: INITIAL     PLAN:  PT FREQUENCY: 1-2x/week  PT DURATION: 6 weeks  PLANNED INTERVENTIONS: Therapeutic exercises, Therapeutic activity, Patient/Family education, Self Care, Joint mobilization, Aquatic Therapy, Dry Needling, Electrical stimulation, Spinal mobilization, Cryotherapy, Moist heat, Taping, Ultrasound, Ionotophoresis 4mg /ml Dexamethasone, Manual therapy, and Re-evaluation  PLAN FOR NEXT SESSION: needs weekly HEP updates (only coming 1x/week due to work schedule and co-pay), postural strengthening, shoulder ROM/shoulder strength, dry needling B shoulders (she is agreeable)  Darleene Cleaver, PTA  05/10/23 5:04 PM  PHYSICAL THERAPY DISCHARGE SUMMARY  Visits from Start of Care: 3  Current functional level related to goals / functional outcomes: See above   Remaining deficits: See above   Education / Equipment: HEP  Plan:  Patient goals were not met. Patient did not return after 05/10/23 and is now discharged from PT.    Jena Gauss, PT  07/07/2023 10:23 AM

## 2023-05-17 ENCOUNTER — Other Ambulatory Visit (INDEPENDENT_AMBULATORY_CARE_PROVIDER_SITE_OTHER): Payer: Managed Care, Other (non HMO)

## 2023-05-17 DIAGNOSIS — E559 Vitamin D deficiency, unspecified: Secondary | ICD-10-CM | POA: Diagnosis not present

## 2023-05-17 LAB — VITAMIN D 25 HYDROXY (VIT D DEFICIENCY, FRACTURES): VITD: 35.55 ng/mL (ref 30.00–100.00)

## 2023-05-18 ENCOUNTER — Ambulatory Visit: Payer: Managed Care, Other (non HMO) | Admitting: Physical Therapy

## 2023-06-01 ENCOUNTER — Ambulatory Visit: Payer: Managed Care, Other (non HMO) | Attending: Family Medicine

## 2023-06-01 ENCOUNTER — Other Ambulatory Visit: Payer: Self-pay | Admitting: Family Medicine

## 2023-06-08 ENCOUNTER — Encounter: Payer: Self-pay | Admitting: Family Medicine

## 2023-06-08 ENCOUNTER — Ambulatory Visit: Payer: Managed Care, Other (non HMO) | Admitting: Physical Therapy

## 2023-06-21 ENCOUNTER — Ambulatory Visit: Payer: Managed Care, Other (non HMO) | Admitting: Family Medicine

## 2023-09-08 ENCOUNTER — Other Ambulatory Visit: Payer: Self-pay | Admitting: Family Medicine

## 2023-09-08 DIAGNOSIS — F39 Unspecified mood [affective] disorder: Secondary | ICD-10-CM

## 2023-09-09 MED ORDER — BUPROPION HCL ER (SR) 200 MG PO TB12
200.0000 mg | ORAL_TABLET | Freq: Two times a day (BID) | ORAL | 0 refills | Status: DC
Start: 2023-09-09 — End: 2024-01-13

## 2023-09-09 NOTE — Telephone Encounter (Signed)
She's due for 6 mo ck. Can be virtual if she prefers.

## 2023-10-11 ENCOUNTER — Encounter (INDEPENDENT_AMBULATORY_CARE_PROVIDER_SITE_OTHER): Payer: Self-pay

## 2023-10-23 ENCOUNTER — Emergency Department (HOSPITAL_BASED_OUTPATIENT_CLINIC_OR_DEPARTMENT_OTHER): Payer: BC Managed Care – PPO

## 2023-10-23 ENCOUNTER — Ambulatory Visit: Payer: Self-pay

## 2023-10-23 ENCOUNTER — Other Ambulatory Visit: Payer: Self-pay

## 2023-10-23 ENCOUNTER — Encounter (HOSPITAL_BASED_OUTPATIENT_CLINIC_OR_DEPARTMENT_OTHER): Payer: Self-pay | Admitting: Emergency Medicine

## 2023-10-23 ENCOUNTER — Emergency Department (HOSPITAL_BASED_OUTPATIENT_CLINIC_OR_DEPARTMENT_OTHER)
Admission: EM | Admit: 2023-10-23 | Discharge: 2023-10-23 | Disposition: A | Discharge status: Home / Self Care | Payer: BC Managed Care – PPO | Attending: Emergency Medicine | Admitting: Emergency Medicine

## 2023-10-23 DIAGNOSIS — M545 Low back pain, unspecified: Secondary | ICD-10-CM | POA: Insufficient documentation

## 2023-10-23 DIAGNOSIS — Y92410 Unspecified street and highway as the place of occurrence of the external cause: Secondary | ICD-10-CM | POA: Diagnosis not present

## 2023-10-23 DIAGNOSIS — M542 Cervicalgia: Secondary | ICD-10-CM | POA: Insufficient documentation

## 2023-10-23 NOTE — Discharge Instructions (Signed)
It was a pleasure taking care of you here in the emergency department today your workup today was reassuring.  I would have Tylenol and Motrin for pain, heat to the area.  Make sure to follow-up outpatient, return for any worsening symptoms.
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
## 2023-11-22 ENCOUNTER — Ambulatory Visit: Payer: BC Managed Care – PPO | Admitting: Family Medicine

## 2023-12-01 ENCOUNTER — Encounter: Payer: Self-pay | Admitting: Family Medicine

## 2024-01-03 LAB — LAB REPORT - SCANNED: A1c: 5

## 2024-01-04 ENCOUNTER — Other Ambulatory Visit: Payer: Self-pay

## 2024-01-04 DIAGNOSIS — M67471 Ganglion, right ankle and foot: Secondary | ICD-10-CM

## 2024-01-04 NOTE — Progress Notes (Signed)
 Pt called asking for referral to Dr. Susa Simmonds for right foot ganglion cyst- it's not improving. Referral sent. She will be contacted to schedule an appt.

## 2024-01-13 ENCOUNTER — Other Ambulatory Visit: Payer: Self-pay | Admitting: Family Medicine

## 2024-01-13 DIAGNOSIS — F39 Unspecified mood [affective] disorder: Secondary | ICD-10-CM

## 2024-02-04 ENCOUNTER — Encounter: Payer: Self-pay | Admitting: Family Medicine

## 2024-02-04 ENCOUNTER — Ambulatory Visit (INDEPENDENT_AMBULATORY_CARE_PROVIDER_SITE_OTHER): Payer: BC Managed Care – PPO | Admitting: Family Medicine

## 2024-02-04 VITALS — BP 128/76 | HR 89 | Temp 98.0°F | Resp 16 | Ht 63.0 in | Wt 146.4 lb

## 2024-02-04 DIAGNOSIS — Z0000 Encounter for general adult medical examination without abnormal findings: Secondary | ICD-10-CM

## 2024-02-04 DIAGNOSIS — E559 Vitamin D deficiency, unspecified: Secondary | ICD-10-CM

## 2024-02-04 DIAGNOSIS — F39 Unspecified mood [affective] disorder: Secondary | ICD-10-CM | POA: Diagnosis not present

## 2024-02-04 LAB — COMPREHENSIVE METABOLIC PANEL WITH GFR
ALT: 12 U/L (ref 0–35)
AST: 15 U/L (ref 0–37)
Albumin: 4.4 g/dL (ref 3.5–5.2)
Alkaline Phosphatase: 60 U/L (ref 39–117)
BUN: 11 mg/dL (ref 6–23)
CO2: 29 meq/L (ref 19–32)
Calcium: 9.2 mg/dL (ref 8.4–10.5)
Chloride: 102 meq/L (ref 96–112)
Creatinine, Ser: 0.68 mg/dL (ref 0.40–1.20)
GFR: 99.63 mL/min (ref >60.00)
Glucose, Bld: 83 mg/dL (ref 70–99)
Potassium: 4.3 meq/L (ref 3.5–5.1)
Sodium: 138 meq/L (ref 135–145)
Total Bilirubin: 0.3 mg/dL (ref 0.2–1.2)
Total Protein: 7.2 g/dL (ref 6.0–8.3)

## 2024-02-04 LAB — CBC
HCT: 41.9 % (ref 36.0–46.0)
Hemoglobin: 13.7 g/dL (ref 12.0–15.0)
MCHC: 32.8 g/dL (ref 30.0–36.0)
MCV: 88.7 fl (ref 78.0–100.0)
Platelets: 369 10*3/uL (ref 150.0–400.0)
RBC: 4.72 Mil/uL (ref 3.87–5.11)
RDW: 12.6 % (ref 11.5–15.5)
WBC: 5.1 10*3/uL (ref 4.0–10.5)

## 2024-02-04 LAB — LIPID PANEL
Cholesterol: 165 mg/dL (ref 0–200)
HDL: 65.7 mg/dL (ref >39.00)
LDL Cholesterol: 88 mg/dL (ref 0–99)
NonHDL: 99.34
Total CHOL/HDL Ratio: 3
Triglycerides: 59 mg/dL (ref 0.0–149.0)
VLDL: 11.8 mg/dL (ref 0.0–40.0)

## 2024-02-04 LAB — VITAMIN D 25 HYDROXY (VIT D DEFICIENCY, FRACTURES): VITD: 35.79 ng/mL (ref 30.00–100.00)

## 2024-02-04 MED ORDER — WEGOVY 1 MG/0.5ML ~~LOC~~ SOAJ: 1.0000 mg | SUBCUTANEOUS | 2 refills | Status: AC

## 2024-02-04 MED ORDER — BUPROPION HCL ER (SR) 200 MG PO TB12: 200.0000 mg | ORAL_TABLET | Freq: Two times a day (BID) | ORAL | 1 refills | Status: DC

## 2024-02-04 MED ORDER — MELOXICAM 15 MG PO TABS: 15.0000 mg | ORAL_TABLET | Freq: Every day | ORAL | 2 refills | Status: DC
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
## 2024-02-04 NOTE — Patient Instructions (Signed)
 Give [place] 2-3 business days to get the results of your labs back.   Keep the diet clean and stay active.  Please get me a copy of your advanced directive form at your convenience.   Let us know if you need anything.

## 2024-02-10 ENCOUNTER — Other Ambulatory Visit: Payer: Self-pay | Admitting: Otolaryngology

## 2024-02-11 LAB — SURGICAL PATHOLOGY

## 2024-08-08 ENCOUNTER — Encounter: Payer: Self-pay | Admitting: Family Medicine

## 2024-08-08 ENCOUNTER — Ambulatory Visit: Admitting: Family Medicine

## 2024-08-08 VITALS — BP 132/82 | HR 86 | Temp 98.0°F | Resp 16 | Ht 63.0 in | Wt 144.8 lb

## 2024-08-08 DIAGNOSIS — M7711 Lateral epicondylitis, right elbow: Secondary | ICD-10-CM | POA: Diagnosis not present

## 2024-08-08 NOTE — Progress Notes (Signed)
 Musculoskeletal Exam  Patient: Tonya Duran DOB: Nov 01, 1970  DOS: 08/08/2024  SUBJECTIVE:  Chief Complaint:   Chief Complaint  Patient presents with   Arm Pain    Right Arm and Elbow Pain    Tonya Duran is a 53 y.o.  female for evaluation and treatment of R elbow pain.   Onset:  2 weeks ago. Lifting more as she is in the moving process.  Location: lateral elbow.  Character:  sharp  Progression of issue:  is unchanged Associated symptoms: no bruising, redness, swelling, decreased ROM Treatment: to date has been OTC NSAIDS and prescription NSAIDS.   Neurovascular symptoms: no  Past Medical History:  Diagnosis Date   Abnormal uterine bleeding    Abnormal uterine bleeding (AUB) 10/13/2018   Attention deficit hyperactivity disorder (ADHD) 06/25/2020   B12 deficiency    Back pain    Breast cancer screening 03/02/2014   Chronic midline low back pain without sciatica 08/02/2019   ETD (eustachian tube dysfunction) 03/02/2014   Hypertension    No known health problems    Palpitations    Pre-diabetes    Right foot pain 06/19/2012   Right groin pain 02/23/2014   Right hip pain 03/05/2014   Tired    Visit for preventive health examination 03/02/2014   Vitamin D  deficiency     Objective: VITAL SIGNS: BP 132/82 (BP Location: Left Arm, Patient Position: Sitting)   Pulse 86   Temp 98 F (36.7 C) (Oral)   Resp 16   Ht 5' 3 (1.6 m)   Wt 144 lb 12.8 oz (65.7 kg)   SpO2 96%   BMI 25.65 kg/m  Constitutional: Well formed, well developed. No acute distress. Thorax & Lungs: No accessory muscle use Musculoskeletal: R elbow.   Normal active range of motion: yes.   Normal passive range of motion: yes Tenderness to palpation: yes over lat epicondyle  Pain w resisted wrist extension Deformity: no Ecchymosis: no Neurologic: Normal sensory function Psychiatric: Normal mood. Age appropriate judgment and insight. Alert & oriented x 3.    Assessment:  Lateral  epicondylitis of right elbow  Plan: Stretches/exercises, heat, ice, Tylenol, forearm strap. PT if no better in 3-4 weeks.  F/u prn. The patient voiced understanding and agreement to the plan.   Mabel [place], DO 08/08/24  2:32 PM

## 2024-08-08 NOTE — Patient Instructions (Signed)
 Ice/cold pack over area for 10-15 min twice daily.  Heat (pad or rice pillow in microwave) over affected area, 10-15 minutes twice daily.   OK to take Tylenol 1000 mg (2 extra strength tabs) or 975 mg (3 regular strength tabs) every 6 hours as needed.  Consider a forearm strap (Band-IT) to help with your elbow. This can give your elbow a break and allow it to heal faster.  Let us  know if you need anything.  Elbow and Forearm Exercises It is normal to feel mild stretching, pulling, tightness, or discomfort as you do these exercises, but you should stop right away if you feel sudden pain or your pain gets worse.  RANGE OF MOTION EXERCISES These exercises warm up your muscles and joints and improve the movement and flexibility of your injured elbow and forearm. These exercises also help to relieve pain, numbness, and tingling. These exercises are done using the muscles in your injured elbow and forearm. Exercise A: Elbow Flexion, Active Hold your left / right arm at your side, and bend your elbow as far as you can using your left / right arm muscles. Hold this position for 30 seconds. Slowly return to the starting position. Repeat 2 times. Complete this exercise 3 times per week. Exercise B: Elbow Extension, Active Hold your left / right arm at your side, and straighten your elbow as much as you can using your left / right arm muscles. Hold this position for 30 seconds. Slowly return to the starting position. Repeat 2 times. Complete this exercise 3 times per week. Exercise C: Forearm Rotation, Supination, Active Stand or sit with your elbows at your sides. Bend your left / right elbow to an L shape (90 degrees). Turn your palm upward until you feel a gentle stretch on the inside of your forearm. Hold this position for 30 seconds. Slowly release and return to the starting position. Repeat 2 times. Complete this exercise 3 times per week. Exercise D: Forearm Rotation, Pronation,  Active Stand or sit with your elbows at your side. Bend your left / right elbow to an L shape (90 degrees). Turn your left / right palm downward until you feel a gentle stretch on the top of your forearm. Hold this position for 30 seconds. Slowly release and return to the starting position. Repeat 2 times. Complete this exercise 3 times per week. STRETCHING EXERCISES These exercises warm up your muscles and joints and improve the movement and flexibility of your injured elbow and forearm. These exercises also help to relieve pain, numbness, and tingling. These exercises are done using your healthy elbow and forearm to help stretch the muscles in your injured elbow and forearm. Exercise E: Elbow Flexion, Active-Assisted  Hold your left / right arm at your side, and bend your elbow as much as you can using your left / right arm muscles. Use your other hand to bend your left / right elbow farther. To do this, gently push up on your forearm until you feel a gentle stretch on the back of your elbow. Hold this position for 30 seconds. Slowly return to the starting position. Repeat 2 times. Complete this exercise 3 times per week. Exercise F: Elbow Extension, Active-Assisted  Hold your left / right arm at your side, and straighten your elbow as much as you can using your left / right arm muscles. Use your other hand to straighten the left / right elbow farther. To do this, gently push down on your forearm until you  feel a gentle stretch on the inside of your elbow. Hold this position for 30 seconds. Slowly return to the starting position. Repeat 2 times. Complete this exercise 3 times per weeky. Exercise G: Forearm Rotation, Supination, Active-Assisted  Sit with your left / right elbow bent in an L shape (90 degrees) with your forearm resting on a table. Keeping your upper body and shoulder still, rotate your forearm so your left / right palm faces upward. Use your other hand to help rotate  your forearm further until you feel a gentle to moderate stretch. Hold this position for 30 seconds. Slowly release the stretch and return to the starting position. Repeat 2 times. Complete this exercise 3 times per week. Exercise H: Forearm Rotation, Pronation, Active-Assisted  Sit with your left / right elbow bent in an L shape (90 degrees) with your forearm resting on a table. Keeping your upper body and shoulder still, rotate your forearm so your palm faces the tabletop. Use your other hand to help rotate your forearm further until you feel a gentle to moderate stretch. Hold this position for 30 seconds. Slowly release the stretch and return to the starting position. Repeat 2 times. Complete this exercise 3 times per week. Exercise I: Elbow Flexion, Supine, Passive Lie on your back. Extend your left / right arm up in the air, bracing it with your other hand. Let your left / right your hand slowly lower toward your shoulder, while your elbow stays pointed toward the ceiling. You should feel a gentle stretch along the back of your upper arm and elbow. If instructed by your health care provider, you may increase the intensity of your stretch by adding a small wrist weight or hand weight. Hold this position for 3 seconds. Slowly return to the starting position. Repeat 2 times. Complete this exercise 3 times per week. Exercise J: Elbow Extension, Supine, Passive  Lie on your back. Make sure that you are in a comfortable position that lets you relax your arm muscles. Place a folded towel under your left / right upper arm so your elbow and shoulder are at the same height. Straighten your left / right arm so your elbow does not rest on the bed or towel. Let the weight of your hand stretch your elbow. Keep your arm and chest muscles relaxed. You should feel a stretch on the inside of your elbow. If told by your health care provider, you may increase the intensity of your stretch by adding a  small wrist weight or hand weight. Hold this position for 30 seconds. Slowly release the stretch. Repeat 2 times. Complete this exercise 3 times per week. STRENGTHENING EXERCISES These exercises build strength and endurance in your elbow and forearm. Endurance is the ability to use your muscles for a long time, even after they get tired. Exercise K: Elbow Flexion, Isometric  Stand or sit up straight. Bend your left / right elbow in an L shape (90 degrees) and turn your palm up so your forearm is at the height of your waist. Place your other hand on top of your forearm. Gently push down as your left / right arm resists. Push as hard as you can with both arms without causing any pain or movement at your left / right elbow. Hold this position for 3 seconds. Slowly release the tension in both arms. Let your muscles relax completely before repeating. Repeat 2 times. Complete this exercise 3 times per week. Exercise L: Elbow Extensors, Isometric  Stand  or sit up straight. Place your left / right arm so your palm faces your abdomen and it is at the height of your waist. Place your other hand on the underside of your forearm. Gently push up as your left / right arm resists. Push as hard as you can with both arms, without causing any pain or movement at your left / right elbow. Hold this position for 3 seconds. Slowly release the tension in both arms. Let your muscles relax completely before repeating. Repeat _______2___ times. Complete this exercise 3 times per week. Exercise M: Elbow Flexion With Forearm Palm Up  Sit upright on a firm chair without armrests, or stand. Place your left / right arm at your side with your palm facing forward. Holding a 5 lbweight or gripping a rubber exercise band or tubing, bend your elbow to bring your hand toward your shoulder. Hold this position for 3 seconds. Slowly return to the starting position. Repeat 2 times. Complete this exercise 3 times per  week. Exercise N: Elbow Extension  Sit on a firm chair without armrests, or stand. Keeping your upper arms at your sides, bring both hands up toward your left / right shoulder while you grip a rubber exercise band or tubing. Your left / right hand should be just below the other hand. Straighten your left / right elbow. Hold this position for 3 seconds. Control the resistance of the band or tubing as your hand returns to your side. Repeat 2 times. Complete this exercise 3 times per week. Exercise O: Forearm Rotation, Supination  Sit with your left / right forearm supported on a table. Keep your elbow at waist height. Rest your hand over the edge of the table with your palm facing down. Gently hold a lightweight hammer. Without moving your elbow, slowly rotate your forearm to turn your palm and hand upward to a "thumbs-up" position. Hold this position for 3 seconds. Slowly return to the starting position. Repeat 2 times. Complete this exercise 3 times per week. Exercise P: Forearm Rotation, Pronation  Sit with your left / right forearm supported on a table. Keep your elbow below shoulder height. Rest your hand over the edge of the table with your palm facing up. Gently hold a lightweight hammer. Without moving your elbow, slowly rotate your forearm to turn your palm and hand upward to a "thumbs-up" position. Hold this position for 3 seconds. Slowly return to the starting position. Repeat 2 times. Complete this exercise 3 times per week.  Make sure you discuss any questions you have with your health care provider. Document Released: 07/29/2005 Document Revised: 01/23/2016 Document Reviewed: 06/09/2015 Elsevier Interactive Patient Education  [employer].

## 2024-08-20 ENCOUNTER — Other Ambulatory Visit: Payer: Self-pay | Admitting: Family Medicine

## 2024-08-20 DIAGNOSIS — F39 Unspecified mood [affective] disorder: Secondary | ICD-10-CM

## 2024-08-29 ENCOUNTER — Telehealth: Admitting: Family Medicine

## 2024-08-29 ENCOUNTER — Encounter: Payer: Self-pay | Admitting: Family Medicine

## 2024-08-29 DIAGNOSIS — F411 Generalized anxiety disorder: Secondary | ICD-10-CM

## 2024-08-29 MED ORDER — BUSPIRONE HCL 5 MG PO TABS: 5.0000 mg | ORAL_TABLET | Freq: Two times a day (BID) | ORAL | 1 refills | Status: DC

## 2024-08-29 NOTE — Progress Notes (Signed)
 Chief Complaint  Patient presents with   Medication Refill    Medication Check    Subjective Tonya Duran presents for f/u anxiety/depression. We are interacting via web portal for an electronic face-to-face visit. I verified patient's ID using 2 identifiers. Patient agreed to proceed with visit via this method. Patient is at in a parked car, I am at office. Patient and I are present for visit.    Pt is currently being treated with Wellbutrin  200 mg bid.  Reports struggling since her mom passed away and work is been quite stressful. No thoughts of harming self or others. No self-medication with alcohol, prescription drugs or illicit drugs. She is not exercising routinely. Pt is not following with a counselor/psychologist.  Past Medical History:  Diagnosis Date   Abnormal uterine bleeding    Abnormal uterine bleeding (AUB) 10/13/2018   Attention deficit hyperactivity disorder (ADHD) 06/25/2020   B12 deficiency    Back pain    Breast cancer screening 03/02/2014   Chronic midline low back pain without sciatica 08/02/2019   ETD (eustachian tube dysfunction) 03/02/2014   Hypertension    No known health problems    Palpitations    Pre-diabetes    Right foot pain 06/19/2012   Right groin pain 02/23/2014   Right hip pain 03/05/2014   Tired    Visit for preventive health examination 03/02/2014   Vitamin D  deficiency    Allergies as of 08/29/2024   No Known Allergies      Medication List        Accurate as of August 29, 2024  4:20 PM. If you have any questions, ask your nurse or doctor.          buPROPion  200 MG 12 hr tablet Commonly known as: WELLBUTRIN  SR Take 1 tablet (200 mg total) by mouth 2 (two) times daily.   busPIRone 5 MG tablet Commonly known as: BUSPAR Take 1 tablet (5 mg total) by mouth 2 (two) times daily. Started by: Mabel Deward Pry   meloxicam  15 MG tablet Commonly known as: MOBIC  Take 1 tablet (15 mg total) by mouth daily.    multivitamin tablet Take 1 tablet by mouth daily.   Wegovy  1 MG/0.5ML Soaj SQ injection Generic drug: semaglutide -weight management Inject 1 mg into the skin once a week.        Exam No conversational dyspnea Age appropriate judgment and insight Nml affect and mood  Assessment and Plan  GAD (generalized anxiety disorder) - Plan: busPIRone (BUSPAR) 5 MG tablet  Chronic, uncontrolled.  Continue Wellbutrin  200 mg twice daily.  Add BuSpar 5 mg twice daily.  Recommended reestablishing with the counseling team.  Counseled on exercise.  Offered short-term relief so she can properly grieve and get closure with her mother's estate.  She politely declined for now.  She will let me know if she changes her mind. F/u in 1 month. The patient voiced understanding and agreement to the plan.  Mabel Deward [place], DO 08/29/24 4:20 PM

## 2024-09-06 ENCOUNTER — Other Ambulatory Visit: Payer: Self-pay | Admitting: Family Medicine

## 2024-09-14 ENCOUNTER — Encounter: Payer: Self-pay | Admitting: Family Medicine

## 2024-09-20 ENCOUNTER — Other Ambulatory Visit: Payer: Self-pay | Admitting: Family Medicine

## 2024-09-20 DIAGNOSIS — F411 Generalized anxiety disorder: Secondary | ICD-10-CM

## 2024-10-13 ENCOUNTER — Telehealth: Payer: Self-pay

## 2024-10-13 NOTE — Telephone Encounter (Signed)
 She is transitioning from another GLP-1 from a cash pay clinic. Does that matter?

## 2024-10-13 NOTE — Telephone Encounter (Signed)
 Called pt and sent her message to let us  know about documents from other clinic.

## 2024-10-13 NOTE — Telephone Encounter (Signed)
 PA not completed, Pt w/ a BMI of 25. Unfortunately I won't be able to give insurance her previous BMI from her start in 2023 since it has been so long.

## 2024-10-13 NOTE — Telephone Encounter (Signed)
 If she can provide us  with those records, maybe.

## 2024-10-16 NOTE — Telephone Encounter (Signed)
 Received, forwarded to PA team for them to complete.

## 2024-11-01 ENCOUNTER — Other Ambulatory Visit: Payer: Self-pay | Admitting: Family Medicine

## 2024-11-01 DIAGNOSIS — Z1231 Encounter for screening mammogram for malignant neoplasm of breast: Secondary | ICD-10-CM

## 2024-11-16 ENCOUNTER — Encounter

## 2024-11-16 DIAGNOSIS — Z1231 Encounter for screening mammogram for malignant neoplasm of breast: Secondary | ICD-10-CM
